# Patient Record
Sex: Male | Born: 1960 | Race: White | Hispanic: No | Smoking: Former smoker
Health system: Southern US, Community
[De-identification: ages and names within clinical notes are randomized; demographics above are authoritative.]

## PROBLEM LIST (undated history)

## (undated) DIAGNOSIS — J189 Pneumonia, unspecified organism: Secondary | ICD-10-CM

## (undated) DIAGNOSIS — T7840XA Allergy, unspecified, initial encounter: Secondary | ICD-10-CM

## (undated) HISTORY — PX: BREAST BIOPSY: SHX20

## (undated) HISTORY — DX: Allergy, unspecified, initial encounter: T78.40XA

---

## 2008-08-09 ENCOUNTER — Emergency Department (HOSPITAL_COMMUNITY): Admission: EM | Admit: 2008-08-09 | Discharge: 2008-08-09 | Payer: Self-pay | Admitting: Emergency Medicine

## 2011-05-21 LAB — DIFFERENTIAL
Basophils Absolute: 0 10*3/uL (ref 0.0–0.1)
Basophils Relative: 0 % (ref 0–1)
Eosinophils Absolute: 0 10*3/uL (ref 0.0–0.7)
Eosinophils Relative: 0 % (ref 0–5)
Lymphocytes Relative: 3 % — ABNORMAL LOW (ref 12–46)
Lymphs Abs: 0.4 10*3/uL — ABNORMAL LOW (ref 0.7–4.0)
Monocytes Absolute: 0.3 10*3/uL (ref 0.1–1.0)
Monocytes Relative: 2 % — ABNORMAL LOW (ref 3–12)
Neutro Abs: 10.2 10*3/uL — ABNORMAL HIGH (ref 1.7–7.7)
Neutrophils Relative %: 94 % — ABNORMAL HIGH (ref 43–77)

## 2011-05-21 LAB — BASIC METABOLIC PANEL
BUN: 16 mg/dL (ref 6–23)
CO2: 24 mEq/L (ref 19–32)
Calcium: 8.6 mg/dL (ref 8.4–10.5)
Chloride: 108 mEq/L (ref 96–112)
Creatinine, Ser: 0.76 mg/dL (ref 0.4–1.5)
GFR calc Af Amer: >60 mL/min (ref >60)
GFR calc non Af Amer: >60 mL/min (ref >60)
Glucose, Bld: 123 mg/dL — ABNORMAL HIGH (ref 70–99)
Potassium: 3.6 mEq/L (ref 3.5–5.1)
Sodium: 141 mEq/L (ref 135–145)

## 2011-05-21 LAB — RAPID STREP SCREEN (MED CTR MEBANE ONLY): Streptococcus, Group A Screen (Direct): NEGATIVE

## 2011-05-21 LAB — CBC
HCT: 51.6 % (ref 39.0–52.0)
Hemoglobin: 17.7 g/dL — ABNORMAL HIGH (ref 13.0–17.0)
MCHC: 34.2 g/dL (ref 30.0–36.0)
MCV: 96.4 fL (ref 78.0–100.0)
Platelets: 254 10*3/uL (ref 150–400)
RBC: 5.36 MIL/uL (ref 4.22–5.81)
RDW: 13.3 % (ref 11.5–15.5)
WBC: 10.9 10*3/uL — ABNORMAL HIGH (ref 4.0–10.5)

## 2013-02-27 ENCOUNTER — Other Ambulatory Visit (INDEPENDENT_AMBULATORY_CARE_PROVIDER_SITE_OTHER): Payer: Managed Care, Other (non HMO)

## 2013-02-27 ENCOUNTER — Ambulatory Visit (INDEPENDENT_AMBULATORY_CARE_PROVIDER_SITE_OTHER)
Admission: RE | Admit: 2013-02-27 | Discharge: 2013-02-27 | Disposition: A | Discharge status: Home / Self Care | Payer: Managed Care, Other (non HMO) | Source: Ambulatory Visit | Attending: Internal Medicine | Admitting: Internal Medicine

## 2013-02-27 ENCOUNTER — Encounter: Payer: Self-pay | Admitting: Internal Medicine

## 2013-02-27 ENCOUNTER — Ambulatory Visit (INDEPENDENT_AMBULATORY_CARE_PROVIDER_SITE_OTHER): Payer: Managed Care, Other (non HMO) | Admitting: Internal Medicine

## 2013-02-27 VITALS — BP 120/84 | HR 72 | Temp 97.7°F | Resp 16 | Ht 69.0 in | Wt 183.0 lb

## 2013-02-27 DIAGNOSIS — R0609 Other forms of dyspnea: Secondary | ICD-10-CM

## 2013-02-27 DIAGNOSIS — R0600 Dyspnea, unspecified: Secondary | ICD-10-CM | POA: Insufficient documentation

## 2013-02-27 DIAGNOSIS — Z0000 Encounter for general adult medical examination without abnormal findings: Secondary | ICD-10-CM | POA: Insufficient documentation

## 2013-02-27 DIAGNOSIS — R7989 Other specified abnormal findings of blood chemistry: Secondary | ICD-10-CM

## 2013-02-27 DIAGNOSIS — R0989 Other specified symptoms and signs involving the circulatory and respiratory systems: Secondary | ICD-10-CM

## 2013-02-27 DIAGNOSIS — F17200 Nicotine dependence, unspecified, uncomplicated: Secondary | ICD-10-CM | POA: Insufficient documentation

## 2013-02-27 LAB — CBC WITH DIFFERENTIAL/PLATELET
Basophils Absolute: 0 10*3/uL (ref 0.0–0.1)
Basophils Relative: 0.3 % (ref 0.0–3.0)
Eosinophils Absolute: 0.2 10*3/uL (ref 0.0–0.7)
Eosinophils Relative: 1.5 % (ref 0.0–5.0)
HCT: 51 % (ref 39.0–52.0)
Hemoglobin: 17.4 g/dL — ABNORMAL HIGH (ref 13.0–17.0)
Lymphocytes Relative: 15.9 % (ref 12.0–46.0)
Lymphs Abs: 2.4 10*3/uL (ref 0.7–4.0)
MCHC: 34.1 g/dL (ref 30.0–36.0)
MCV: 97.3 fl (ref 78.0–100.0)
Monocytes Absolute: 0.6 10*3/uL (ref 0.1–1.0)
Monocytes Relative: 3.9 % (ref 3.0–12.0)
Neutro Abs: 11.6 10*3/uL — ABNORMAL HIGH (ref 1.4–7.7)
Neutrophils Relative %: 78.4 % — ABNORMAL HIGH (ref 43.0–77.0)
Platelets: 266 10*3/uL (ref 150.0–400.0)
RBC: 5.24 Mil/uL (ref 4.22–5.81)
RDW: 13.2 % (ref 11.5–14.6)
WBC: 14.8 10*3/uL — ABNORMAL HIGH (ref 4.5–10.5)

## 2013-02-27 LAB — URINALYSIS, ROUTINE W REFLEX MICROSCOPIC
Bilirubin Urine: NEGATIVE
Ketones, ur: NEGATIVE
Leukocytes, UA: NEGATIVE
Nitrite: NEGATIVE
Specific Gravity, Urine: >=1.03 (ref 1.000–1.030)
Total Protein, Urine: NEGATIVE
Urine Glucose: NEGATIVE
Urobilinogen, UA: 0.2 (ref 0.0–1.0)
pH: 5.5 (ref 5.0–8.0)

## 2013-02-27 LAB — HEPATITIS C ANTIBODY: HCV Ab: NEGATIVE

## 2013-02-27 LAB — COMPREHENSIVE METABOLIC PANEL
ALT: 38 U/L (ref 0–53)
AST: 26 U/L (ref 0–37)
Albumin: 4.1 g/dL (ref 3.5–5.2)
Alkaline Phosphatase: 86 U/L (ref 39–117)
BUN: 10 mg/dL (ref 6–23)
CO2: 28 mEq/L (ref 19–32)
Calcium: 9.8 mg/dL (ref 8.4–10.5)
Chloride: 104 mEq/L (ref 96–112)
Creatinine, Ser: 0.9 mg/dL (ref 0.4–1.5)
GFR: 99.34 mL/min (ref >60.00)
Glucose, Bld: 84 mg/dL (ref 70–99)
Potassium: 4.9 mEq/L (ref 3.5–5.1)
Sodium: 139 mEq/L (ref 135–145)
Total Bilirubin: 0.7 mg/dL (ref 0.3–1.2)
Total Protein: 7.2 g/dL (ref 6.0–8.3)

## 2013-02-27 LAB — LIPID PANEL
Cholesterol: 216 mg/dL — ABNORMAL HIGH (ref 0–200)
HDL: 39.3 mg/dL (ref >39.00)
Total CHOL/HDL Ratio: 5
Triglycerides: 250 mg/dL — ABNORMAL HIGH (ref 0.0–149.0)
VLDL: 50 mg/dL — ABNORMAL HIGH (ref 0.0–40.0)

## 2013-02-27 LAB — TSH: TSH: 0.9 u[IU]/mL (ref 0.35–5.50)

## 2013-02-27 LAB — PSA: PSA: 0.52 ng/mL (ref 0.10–4.00)

## 2013-02-27 LAB — LDL CHOLESTEROL, DIRECT: Direct LDL: 137.1 mg/dL

## 2013-02-27 NOTE — Patient Instructions (Signed)
Shortness of Breath Shortness of breath means you have trouble breathing. Shortness of breath may indicate that you have a medical problem. You should seek immediate medical care for shortness of breath. CAUSES   Not enough oxygen in the air (as with high altitudes or a smoke-filled room).  Short-term (acute) lung disease, including:  Infections, such as pneumonia.  Fluid in the lungs, such as heart failure.  A blood clot in the lungs (pulmonary embolism).  Long-term (chronic) lung diseases.  Heart disease (heart attack, angina, heart failure, and others).  Low red blood cells (anemia).  Poor physical fitness. This can cause shortness of breath when you exercise.  Chest or back injuries or stiffness.  Being overweight.  Smoking.  Anxiety. This can make you feel like you are not getting enough air. DIAGNOSIS  Serious medical problems can usually be found during your physical exam. Tests may also be done to determine why you are having shortness of breath. Tests may include:  Chest X-rays.  Lung function tests.  Blood tests.  Electrocardiography.  Exercise testing.  Echocardiography.  Imaging scans. Your caregiver may not be able to find a cause for your shortness of breath after your exam. In this case, it is important to have a follow-up exam with your caregiver as directed.  TREATMENT  Treatment for shortness of breath depends on the cause of your symptoms and can vary greatly. HOME CARE INSTRUCTIONS   Do not smoke. Smoking is a common cause of shortness of breath. If you smoke, ask for help to quit.  Avoid being around chemicals or things that may bother your breathing, such as paint fumes and dust.  Rest as needed. Slowly resume your usual activities.  If medicines were prescribed, take them as directed for the full length of time directed. This includes oxygen and any inhaled medicines.  Keep all follow-up appointments as directed by your caregiver. SEEK  MEDICAL CARE IF:   Your condition does not improve in the time expected.  You have a hard time doing your normal activities even with rest.  You have any side effects or problems with the medicines prescribed.  You develop any new symptoms. SEEK IMMEDIATE MEDICAL CARE IF:   Your shortness of breath gets worse.  You feel lightheaded, faint, or develop a cough not controlled with medicines.  You start coughing up blood.  You have pain with breathing.  You have chest pain or pain in your arms, shoulders, or abdomen.  You have a fever.  You are unable to walk up stairs or exercise the way you normally do. MAKE SURE YOU:  Understand these instructions.  Will watch your condition.  Will get help right away if you are not doing well or get worse. Document Released: 04/27/2001 Document Revised: 02/01/2012 Document Reviewed: 10/18/2011 [facility] Patient Information 2014 [place], [place]. Health Maintenance, Males A healthy lifestyle and preventative care can promote health and wellness.  Maintain regular health, dental, and eye exams.  Eat a healthy diet. Foods like vegetables, fruits, whole grains, low-fat dairy products, and lean protein foods contain the nutrients you need without too many calories. Decrease your intake of foods high in solid fats, added sugars, and salt. Get information about a proper diet from your caregiver, if necessary.  Regular physical exercise is one of the most important things you can do for your health. Most adults should get at least 150 minutes of moderate-intensity exercise (any activity that increases your heart rate and causes you to sweat) each  week. In addition, most adults need muscle-strengthening exercises on 2 or more days a week.   Maintain a healthy weight. The body mass index (BMI) is a screening tool to identify possible weight problems. It provides an estimate of body fat based on height and weight. Your caregiver can help determine your  BMI, and can help you achieve or maintain a healthy weight. For adults 20 years and older:  A BMI below 18.5 is considered underweight.  A BMI of 18.5 to 24.9 is normal.  A BMI of 25 to 29.9 is considered overweight.  A BMI of 30 and above is considered obese.  Maintain normal blood lipids and cholesterol by exercising and minimizing your intake of saturated fat. Eat a balanced diet with plenty of fruits and vegetables. Blood tests for lipids and cholesterol should begin at age 87 and be repeated every 5 years. If your lipid or cholesterol levels are high, you are over 50, or you are a high risk for heart disease, you may need your cholesterol levels checked more frequently.Ongoing high lipid and cholesterol levels should be treated with medicines, if diet and exercise are not effective.  If you smoke, find out from your caregiver how to quit. If you do not use tobacco, do not start.  If you choose to drink alcohol, do not exceed 2 drinks per day. One drink is considered to be 12 ounces (355 mL) of beer, 5 ounces (148 mL) of wine, or 1.5 ounces (44 mL) of liquor.  Avoid use of street drugs. Do not share needles with anyone. Ask for help if you need support or instructions about stopping the use of drugs.  High blood pressure causes heart disease and increases the risk of stroke. Blood pressure should be checked at least every 1 to 2 years. Ongoing high blood pressure should be treated with medicines if weight loss and exercise are not effective.  If you are 32 to 52 years old, ask your caregiver if you should take aspirin to prevent heart disease.  Diabetes screening involves taking a blood sample to check your fasting blood sugar level. This should be done once every 3 years, after age 55, if you are within normal weight and without risk factors for diabetes. Testing should be considered at a younger age or be carried out more frequently if you are overweight and have at least 1 risk factor  for diabetes.  Colorectal cancer can be detected and often prevented. Most routine colorectal cancer screening begins at the age of 57 and continues through age 55. However, your caregiver may recommend screening at an earlier age if you have risk factors for colon cancer. On a yearly basis, your caregiver may provide home test kits to check for hidden blood in the stool. Use of a small camera at the end of a tube, to directly examine the colon (sigmoidoscopy or colonoscopy), can detect the earliest forms of colorectal cancer. Talk to your caregiver about this at age 65, when routine screening begins. Direct examination of the colon should be repeated every 5 to 10 years through age 60, unless early forms of pre-cancerous polyps or small growths are found.  Hepatitis C blood testing is recommended for all people born from [birth year] through 1965 and any individual with known risks for hepatitis C.  Healthy men should no longer receive prostate-specific antigen (PSA) blood tests as part of routine cancer screening. Consult with your caregiver about prostate cancer screening.  Testicular cancer screening is not recommended  for adolescents or adult males who have no symptoms. Screening includes self-exam, caregiver exam, and other screening tests. Consult with your caregiver about any symptoms you have or any concerns you have about testicular cancer.  Practice safe sex. Use condoms and avoid high-risk sexual practices to reduce the spread of sexually transmitted infections (STIs).  Use sunscreen with a sun protection factor (SPF) of 30 or greater. Apply sunscreen liberally and repeatedly throughout the day. You should seek shade when your shadow is shorter than you. Protect yourself by wearing long sleeves, pants, a wide-brimmed hat, and sunglasses year round, whenever you are outdoors.  Notify your caregiver of new moles or changes in moles, especially if there is a change in shape or color. Also notify your  caregiver if a mole is larger than the size of a pencil eraser.  A one-time screening for abdominal aortic aneurysm (AAA) and surgical repair of large AAAs by sound wave imaging (ultrasonography) is recommended for ages 77 to 6 years who are current or former smokers.  Stay current with your immunizations. Document Released: 01/29/2008 Document Revised: 10/25/2011 Document Reviewed: 12/28/2010 [facility] Patient Information 2014 [place], [place].

## 2013-02-27 NOTE — Progress Notes (Signed)
Subjective:    Patient ID: Joseph [name], male    DOB: [birth year], 52 y.o.   MRN: [id number]  Shortness of Breath This is a new problem. Episode onset: gradual onset DOE 6 months ago. The problem occurs intermittently. The problem has been unchanged. Pertinent negatives include no abdominal pain, chest pain, claudication, coryza, ear pain, fever, headaches, hemoptysis, leg pain, leg swelling, neck pain, orthopnea, PND, rash, rhinorrhea, sore throat, sputum production, swollen glands, syncope, vomiting or wheezing. Nothing aggravates the symptoms. Risk factors include smoking. He has tried nothing for the symptoms. The treatment provided no relief.      Review of Systems  Constitutional: Negative.  Negative for fever, chills, diaphoresis, activity change, appetite change, fatigue and unexpected weight change.  HENT: Negative.  Negative for ear pain, sore throat, rhinorrhea and neck pain.   Eyes: Negative.   Respiratory: Positive for shortness of breath. Negative for apnea, cough, hemoptysis, sputum production, choking, chest tightness, wheezing and stridor.   Cardiovascular: Negative.  Negative for chest pain, palpitations, orthopnea, claudication, leg swelling, syncope and PND.  Gastrointestinal: Negative.  Negative for nausea, vomiting, abdominal pain, diarrhea, constipation and anal bleeding.  Endocrine: Negative.   Genitourinary: Negative.   Musculoskeletal: Negative.   Skin: Negative.  Negative for rash.  Allergic/Immunologic: Negative.   Neurological: Negative.  Negative for dizziness, tremors, seizures, syncope, facial asymmetry, speech difficulty, weakness, light-headedness, numbness and headaches.  Hematological: Negative.  Negative for adenopathy. Does not bruise/bleed easily.  Psychiatric/Behavioral: Negative.        Objective:   Physical Exam  Vitals reviewed. Constitutional: He appears well-developed and well-nourished. No distress.  HENT:  Head: Normocephalic and  atraumatic.  Mouth/Throat: Oropharynx is clear and moist. No oropharyngeal exudate.  Eyes: Conjunctivae are normal. Right eye exhibits no discharge. Left eye exhibits no discharge. No scleral icterus.  Neck: Normal range of motion. Neck supple. No JVD present. No tracheal deviation present. No thyromegaly present.  Cardiovascular: Normal rate, regular rhythm, normal heart sounds and intact distal pulses.  Exam reveals no gallop and no friction rub.   No murmur heard. Pulmonary/Chest: Effort normal and breath sounds normal. No stridor. No respiratory distress. He has no wheezes. He has no rales. He exhibits no tenderness.  Abdominal: Soft. Bowel sounds are normal. He exhibits no distension and no mass. There is no tenderness. There is no rebound and no guarding. Hernia confirmed negative in the right inguinal area and confirmed negative in the left inguinal area.  Genitourinary: Rectum normal, prostate normal, testes normal and penis normal. Rectal exam shows no external hemorrhoid, no internal hemorrhoid, no fissure, no mass, no tenderness and anal tone normal. Guaiac negative stool. Prostate is not enlarged and not tender. Right testis shows no mass, no swelling and no tenderness. Right testis is descended. Left testis shows no mass, no swelling and no tenderness. Left testis is descended. Circumcised. No penile erythema or penile tenderness. No discharge found.  Musculoskeletal: Normal range of motion. He exhibits no edema and no tenderness.  Lymphadenopathy:    He has no cervical adenopathy.       Right: No inguinal adenopathy present.       Left: No inguinal adenopathy present.  Skin: He is not diaphoretic.    Lab Results  Component Value Date   WBC 14.8* 02/27/2013   HGB 17.4* 02/27/2013   HCT 51.0 02/27/2013   PLT 266.0 02/27/2013   GLUCOSE 84 02/27/2013   CHOL 216* 02/27/2013   TRIG 250.0* 02/27/2013   HDL  39.30 02/27/2013   LDLDIRECT 137.1 02/27/2013   ALT 38 02/27/2013   AST 26 02/27/2013    NA 139 02/27/2013   K 4.9 02/27/2013   CL 104 02/27/2013   CREATININE 0.9 02/27/2013   BUN 10 02/27/2013   CO2 28 02/27/2013   TSH 0.90 02/27/2013   PSA 0.52 02/27/2013        Assessment & Plan:

## 2013-02-27 NOTE — Assessment & Plan Note (Signed)
He is not interested in starting chantix He is willing to use nicotine patches to help him quit smoking

## 2013-02-27 NOTE — Assessment & Plan Note (Signed)
EKG and CXR are normal Labs show no causes for DOE - I think this early lung disease from tobacco abuse

## 2013-02-27 NOTE — Assessment & Plan Note (Signed)
Exam done Vaccines were reviewed Labs ordered He was referred for a colonoscopy Pt ed material was given 

## 2013-02-28 ENCOUNTER — Encounter: Payer: Self-pay | Admitting: Internal Medicine

## 2013-05-09 ENCOUNTER — Encounter: Payer: Self-pay | Admitting: *Deleted

## 2013-12-05 ENCOUNTER — Ambulatory Visit (INDEPENDENT_AMBULATORY_CARE_PROVIDER_SITE_OTHER): Payer: Managed Care, Other (non HMO) | Admitting: Internal Medicine

## 2013-12-05 ENCOUNTER — Encounter: Payer: Self-pay | Admitting: Internal Medicine

## 2013-12-05 ENCOUNTER — Telehealth: Payer: Self-pay | Admitting: Internal Medicine

## 2013-12-05 VITALS — BP 118/86 | HR 99 | Temp 98.8°F | Resp 16 | Ht 69.0 in | Wt 184.6 lb

## 2013-12-05 DIAGNOSIS — F17200 Nicotine dependence, unspecified, uncomplicated: Secondary | ICD-10-CM

## 2013-12-05 MED ORDER — VARENICLINE TARTRATE 1 MG PO TABS: 1.0000 mg | ORAL_TABLET | Freq: Two times a day (BID) | ORAL | Status: DC

## 2013-12-05 MED ORDER — VARENICLINE TARTRATE 0.5 MG X 11 & 1 MG X 42 PO MISC: ORAL | Status: DC

## 2013-12-05 NOTE — Progress Notes (Signed)
   Subjective:    Patient ID: Joseph [name], male    DOB: 04-18-1961, 53 y.o.   MRN: [id number]  HPI Comments: He wants to quit smoking, he has been using the nicotine patch but he has developed an allergy to it (itching).     Review of Systems  Constitutional: Negative.  Negative for fever, chills, diaphoresis, appetite change and fatigue.  HENT: Negative.   Eyes: Negative.   Respiratory: Negative.  Negative for cough, choking, chest tightness, shortness of breath and stridor.   Cardiovascular: Negative.  Negative for chest pain, palpitations and leg swelling.  Gastrointestinal: Negative.  Negative for nausea, vomiting, abdominal pain, diarrhea, constipation and blood in stool.  Endocrine: Negative.   Genitourinary: Negative.   Musculoskeletal: Negative.   Skin: Negative.   Allergic/Immunologic: Negative.   Neurological: Negative.  Negative for dizziness, tremors, facial asymmetry, weakness, light-headedness and numbness.  Hematological: Negative.  Negative for adenopathy. Does not bruise/bleed easily.  Psychiatric/Behavioral: Negative.        Objective:   Physical Exam  Vitals reviewed. Constitutional: He is oriented to person, place, and time. He appears well-developed and well-nourished. No distress.  HENT:  Head: Normocephalic and atraumatic.  Mouth/Throat: Oropharynx is clear and moist. No oropharyngeal exudate.  Eyes: Conjunctivae are normal. Right eye exhibits no discharge. Left eye exhibits no discharge. No scleral icterus.  Neck: Normal range of motion. Neck supple. No JVD present. No tracheal deviation present. No thyromegaly present.  Cardiovascular: Normal rate, regular rhythm, normal heart sounds and intact distal pulses.  Exam reveals no gallop and no friction rub.   No murmur heard. Pulmonary/Chest: Effort normal and breath sounds normal. No stridor. No respiratory distress. He has no wheezes. He has no rales. He exhibits no tenderness.  Abdominal: Soft. Bowel  sounds are normal. He exhibits no distension and no mass. There is no tenderness. There is no rebound and no guarding.  Musculoskeletal: Normal range of motion. He exhibits no edema and no tenderness.  Lymphadenopathy:    He has no cervical adenopathy.  Neurological: He is oriented to person, place, and time.  Skin: Skin is warm and dry. No rash noted. He is not diaphoretic. No erythema. No pallor.  Psychiatric: He has a normal mood and affect. His behavior is normal. Judgment and thought content normal.     Lab Results  Component Value Date   WBC 14.8* 02/27/2013   HGB 17.4* 02/27/2013   HCT 51.0 02/27/2013   PLT 266.0 02/27/2013   GLUCOSE 84 02/27/2013   CHOL 216* 02/27/2013   TRIG 250.0* 02/27/2013   HDL 39.30 02/27/2013   LDLDIRECT 137.1 02/27/2013   ALT 38 02/27/2013   AST 26 02/27/2013   NA 139 02/27/2013   K 4.9 02/27/2013   CL 104 02/27/2013   CREATININE 0.9 02/27/2013   BUN 10 02/27/2013   CO2 28 02/27/2013   TSH 0.90 02/27/2013   PSA 0.52 02/27/2013       Assessment & Plan:

## 2013-12-05 NOTE — Telephone Encounter (Signed)
Relevant patient education assigned to patient using Emmi. ° °

## 2013-12-05 NOTE — Patient Instructions (Signed)
Smoking Cessation Quitting smoking is important to your health and has many advantages. However, it is not always easy to quit since nicotine is a very addictive drug. Often times, people try 3 times or more before being able to quit. This document explains the best ways for you to prepare to quit smoking. Quitting takes hard work and a lot of effort, but you can do it. ADVANTAGES OF QUITTING SMOKING  You will live longer, feel better, and live better.  Your body will feel the impact of quitting smoking almost immediately.  Within 20 minutes, blood pressure decreases. Your pulse returns to its normal level.  After 8 hours, carbon monoxide levels in the blood return to normal. Your oxygen level increases.  After 24 hours, the chance of having a heart attack starts to decrease. Your breath, hair, and body stop smelling like smoke.  After 48 hours, damaged nerve endings begin to recover. Your sense of taste and smell improve.  After 72 hours, the body is virtually free of nicotine. Your bronchial tubes relax and breathing becomes easier.  After 2 to 12 weeks, lungs can hold more air. Exercise becomes easier and circulation improves.  The risk of having a heart attack, stroke, cancer, or lung disease is greatly reduced.  After 1 year, the risk of coronary heart disease is cut in half.  After 5 years, the risk of stroke falls to the same as a nonsmoker.  After 10 years, the risk of lung cancer is cut in half and the risk of other cancers decreases significantly.  After 15 years, the risk of coronary heart disease drops, usually to the level of a nonsmoker.  If you are pregnant, quitting smoking will improve your chances of having a healthy baby.  The people you live with, especially any children, will be healthier.  You will have extra money to spend on things other than cigarettes. QUESTIONS TO THINK ABOUT BEFORE ATTEMPTING TO QUIT You may want to talk about your answers with your  caregiver.  Why do you want to quit?  If you tried to quit in the past, what helped and what did not?  What will be the most difficult situations for you after you quit? How will you plan to handle them?  Who can help you through the tough times? Your family? Friends? A caregiver?  What pleasures do you get from smoking? What ways can you still get pleasure if you quit? Here are some questions to ask your caregiver:  How can you help me to be successful at quitting?  What medicine do you think would be best for me and how should I take it?  What should I do if I need more help?  What is smoking withdrawal like? How can I get information on withdrawal? GET READY  Set a quit date.  Change your environment by getting rid of all cigarettes, ashtrays, matches, and lighters in your home, car, or work. Do not let people smoke in your home.  Review your past attempts to quit. Think about what worked and what did not. GET SUPPORT AND ENCOURAGEMENT You have a better chance of being successful if you have help. You can get support in many ways.  Tell your family, friends, and co-workers that you are going to quit and need their support. Ask them not to smoke around you.  Get individual, group, or telephone counseling and support. Programs are available at local hospitals and health centers. Call your local health department for   information about programs in your area.  Spiritual beliefs and practices may help some smokers quit.  Download a "quit meter" on your computer to keep track of quit statistics, such as how long you have gone without smoking, cigarettes not smoked, and money saved.  Get a self-help book about quitting smoking and staying off of tobacco. LEARN NEW SKILLS AND BEHAVIORS  Distract yourself from urges to smoke. Talk to someone, go for a walk, or occupy your time with a task.  Change your normal routine. Take a different route to work. Drink tea instead of coffee.  Eat breakfast in a different place.  Reduce your stress. Take a hot bath, exercise, or read a book.  Plan something enjoyable to do every day. Reward yourself for not smoking.  Explore interactive web-based programs that specialize in helping you quit. GET MEDICINE AND USE IT CORRECTLY Medicines can help you stop smoking and decrease the urge to smoke. Combining medicine with the above behavioral methods and support can greatly increase your chances of successfully quitting smoking.  Nicotine replacement therapy helps deliver nicotine to your body without the negative effects and risks of smoking. Nicotine replacement therapy includes nicotine gum, lozenges, inhalers, nasal sprays, and skin patches. Some may be available over-the-counter and others require a prescription.  Antidepressant medicine helps people abstain from smoking, but how this works is unknown. This medicine is available by prescription.  Nicotinic receptor partial agonist medicine simulates the effect of nicotine in your brain. This medicine is available by prescription. Ask your caregiver for advice about which medicines to use and how to use them based on your health history. Your caregiver will tell you what side effects to look out for if you choose to be on a medicine or therapy. Carefully read the information on the package. Do not use any other product containing nicotine while using a nicotine replacement product.  RELAPSE OR DIFFICULT SITUATIONS Most relapses occur within the first 3 months after quitting. Do not be discouraged if you start smoking again. Remember, most people try several times before finally quitting. You may have symptoms of withdrawal because your body is used to nicotine. You may crave cigarettes, be irritable, feel very hungry, cough often, get headaches, or have difficulty concentrating. The withdrawal symptoms are only temporary. They are strongest when you first quit, but they will go away within  10 14 days. To reduce the chances of relapse, try to:  Avoid drinking alcohol. Drinking lowers your chances of successfully quitting.  Reduce the amount of caffeine you consume. Once you quit smoking, the amount of caffeine in your body increases and can give you symptoms, such as a rapid heartbeat, sweating, and anxiety.  Avoid smokers because they can make you want to smoke.  Do not let weight gain distract you. Many smokers will gain weight when they quit, usually less than 10 pounds. Eat a healthy diet and stay active. You can always lose the weight gained after you quit.  Find ways to improve your mood other than smoking. FOR MORE INFORMATION  www.smokefree.gov  Document Released: 07/27/2001 Document Revised: 02/01/2012 Document Reviewed: 11/11/2011 [employer] Patient Information 2014 [employer].  

## 2013-12-05 NOTE — Assessment & Plan Note (Signed)
Start chantix Pt ed and samples were given

## 2013-12-05 NOTE — Progress Notes (Signed)
Pre visit review using our clinic review tool, if applicable. No additional management support is needed unless otherwise documented below in the visit note. 

## 2018-06-29 ENCOUNTER — Ambulatory Visit (INDEPENDENT_AMBULATORY_CARE_PROVIDER_SITE_OTHER): Payer: 59 | Admitting: Family Medicine

## 2018-06-29 ENCOUNTER — Encounter: Payer: Self-pay | Admitting: Family Medicine

## 2018-06-29 VITALS — BP 124/74 | HR 84 | Temp 97.7°F | Ht 69.0 in | Wt 196.2 lb

## 2018-06-29 DIAGNOSIS — J189 Pneumonia, unspecified organism: Secondary | ICD-10-CM

## 2018-06-29 MED ORDER — IPRATROPIUM BROMIDE 0.06 % NA SOLN: 2.0000 | Freq: Four times a day (QID) | NASAL | 0 refills | Status: DC

## 2018-06-29 MED ORDER — BENZONATATE 200 MG PO CAPS: 200.0000 mg | ORAL_CAPSULE | Freq: Two times a day (BID) | ORAL | 0 refills | Status: DC | PRN

## 2018-06-29 NOTE — Progress Notes (Signed)
Subjective:  Joseph [name] is a 58 y.o. male who presents today with a chief complaint of pneumonia and to establish care  HPI:  CAP, new problem Diagnosed with pneumonia at an urgent care in [place] 3 days ago.  His symptoms overall started about a week ago.  Initially included cough, fatigue, malaise, headache, nausea, and started on a course of azithromycin and Ceftin.  He was also given an injection of steroids.  Symptoms have improved a little bit over the last couple of days, however still has malaise, cough, and fatigue.  He currently vapes flavored water - has stopped nicotine use.  Medication seems to be helping with his symptoms.  No other obvious alleviating or aggravating factors.  He has concerns that his vape use could have contributed to his pneumonia.  ROS: Per HPI, otherwise a complete review of systems was negative.   PMH:  The following were reviewed and entered/updated in epic: Past Medical History:  Diagnosis Date  . Allergy    Patient Active Problem List   Diagnosis Date Noted  . Routine general medical examination at a health care facility 02/27/2013  . DOE (dyspnea on exertion) 02/27/2013  . Tobacco use disorder 02/27/2013   History reviewed. No pertinent surgical history.  Family History  Problem Relation Age of Onset  . Cancer Mother        colon cancer  . Alcohol abuse Father   . Drug abuse Father   . Cancer Sister        Breast Cancer  . Stroke Neg Hx   . Early death Neg Hx   . Hearing loss Neg Hx   . Heart disease Neg Hx   . Hyperlipidemia Neg Hx   . Hypertension Neg Hx   . Kidney disease Neg Hx     Medications- reviewed and updated Current Outpatient Medications  Medication Sig Dispense Refill  . albuterol (PROVENTIL HFA;VENTOLIN HFA) 108 (90 Base) MCG/ACT inhaler Inhale into the lungs every 6 (six) hours as needed for wheezing or shortness of breath.    Marland Kitchen azithromycin (ZITHROMAX) 250 MG tablet Take 250 mg by mouth daily.    .  cefUROXime (CEFTIN) 500 MG tablet Take 500 mg by mouth 2 (two) times daily with a meal.    . ondansetron (ZOFRAN) 8 MG tablet Take 8 mg by mouth every 8 (eight) hours as needed for nausea or vomiting.     No current facility-administered medications for this visit.     Allergies-reviewed and updated Allergies  Allergen Reactions  . Sulfa Antibiotics     Social History   Socioeconomic History  . Marital status: Divorced    Spouse name: Not on file  . Number of children: Not on file  . Years of education: Not on file  . Highest education level: Not on file  Occupational History  . Not on file  Social Needs  . Financial resource strain: Not on file  . Food insecurity:    Worry: Not on file    Inability: Not on file  . Transportation needs:    Medical: Not on file    Non-medical: Not on file  Tobacco Use  . Smoking status: Former Research scientist (life sciences)  . Smokeless tobacco: Never Used  Substance and Sexual Activity  . Alcohol use: No  . Drug use: No  . Sexual activity: Not Currently  Lifestyle  . Physical activity:    Days per week: Not on file    Minutes per session: Not on  file  . Stress: Not on file  Relationships  . Social connections:    Talks on phone: Not on file    Gets together: Not on file    Attends religious service: Not on file    Active member of club or organization: Not on file    Attends meetings of clubs or organizations: Not on file    Relationship status: Not on file  Other Topics Concern  . Not on file  Social History Narrative  . Not on file    Objective:  Physical Exam: BP 124/74 (BP Location: Left Arm, Patient Position: Sitting, Cuff Size: Normal)   Pulse 84   Temp 97.7 F (36.5 C) (Oral)   Ht 5\' 9"  (1.753 m)   Wt 196 lb 3.2 oz (89 kg)   SpO2 92%   BMI 28.97 kg/m   Gen: NAD, resting comfortably HEENT: TMs with clear effusion.  Nose mucosa erythematous and boggy with clear nasal discharge. CV: RRR with no murmurs appreciated Pulm: NWOB, CTAB  with no crackles, wheezes, or rhonchi GI: Normal bowel sounds present. Soft, Nontender, Nondistended. MSK: No edema, cyanosis, or clubbing noted Skin: Warm, dry Neuro: Grossly normal, moves all extremities Psych: Normal affect and thought content  Assessment/Plan:  Community-acquired pneumonia Lungs are clear on exam today.  Vital signs are normal.  Encouraged patient to complete his full course of antibiotics.  I will also send in Atrovent nasal spray for his rhinorrhea and Tessalon for his cough.  Discussed normal course of illness.  Discussed they may have lingering cough for the next few weeks.  Discussed reasons to return to care and seek emergent care.  Vape Use He would like to quit.  He will follow-up with our behavioral specialist soon.  Preventative Healthcare Patient was instructed to return soon for CPE. Health Maintenance Due  Topic Date Due  . HIV Screening  05/15/1976  . COLONOSCOPY  05/16/2011  . INFLUENZA VACCINE  03/16/2018   Algis Greenhouse. Jerline Pain, MD 06/29/2018 8:41 AM

## 2018-06-29 NOTE — Patient Instructions (Signed)
It was very nice to see you today!  Please finish all your antibiotics.  I will send in a nasal spray for you.  Please use 3-4 times daily as needed.  I will also send in cough medication for you.  Please let me know if your symptoms do not gradually improve over the next several days.  Come back to see me soon for your annual physical.  Take care, Dr Jerline Pain

## 2018-06-29 NOTE — Addendum Note (Signed)
Addended by: Vivi Barrack on: 06/29/2018 12:19 PM   Modules accepted: Level of Service

## 2018-10-17 ENCOUNTER — Encounter: Payer: Self-pay | Admitting: Family Medicine

## 2018-10-17 ENCOUNTER — Ambulatory Visit: Payer: Commercial Managed Care - PPO | Admitting: Family Medicine

## 2018-10-17 VITALS — BP 126/70 | HR 80 | Temp 97.9°F | Ht 69.0 in | Wt 197.6 lb

## 2018-10-17 DIAGNOSIS — L299 Pruritus, unspecified: Secondary | ICD-10-CM | POA: Insufficient documentation

## 2018-10-17 DIAGNOSIS — Z23 Encounter for immunization: Secondary | ICD-10-CM | POA: Diagnosis not present

## 2018-10-17 MED ORDER — HYDROXYZINE HCL 50 MG PO TABS: 25.0000 mg | ORAL_TABLET | Freq: Every evening | ORAL | 1 refills | Status: DC | PRN

## 2018-10-17 NOTE — Progress Notes (Signed)
   Chief Complaint:  Joseph [name] is a 58 y.o. male who presents today with a chief complaint of rash.   Assessment/Plan:  Pruritus Patient has a significant amount of xerosis cutis which is causing pruritus and histamine release.  Will treat symptomatically with hydroxyzine 25 to 100 mg at bedtime as needed.  Hopefully this will also help some with his insomnia.  Discussed potential side effects.  Also recommended over-the-counter second-generation antihistamine such as Claritin or Zyrtec to use daily.  He can use topical cortisone cream as needed to difficult to treat areas.  Discussed reasons to return to care.  Follow-up as needed.  Preventative Healthcare Patient was instructed to return soon for CPE.  Flu shot given today. Health Maintenance Due  Topic Date Due  . HIV Screening  05/15/1976  . COLONOSCOPY  05/16/2011  . INFLUENZA VACCINE  03/16/2018     Subjective:  HPI:  Rash Started a few months ago.  Located on bilateral legs and scalp.  Symptoms seem to be worsening.  Symptoms seem to be worse at night.  He has had some difficulty sleeping due to severe pruritus.  No obvious exposures.  No recent changes to soaps, detergents, or any other product.  No treatments tried.  No fevers or chills.  No sick contacts.  No other obvious alleviating or aggravating factors.  ROS: Per HPI  PMH: He reports that he has quit smoking. He has never used smokeless tobacco. He reports that he does not drink alcohol or use drugs.      Objective:  Physical Exam: BP 126/70 (BP Location: Left Arm, Patient Position: Sitting, Cuff Size: Normal)   Pulse 80   Temp 97.9 F (36.6 C) (Oral)   Ht 5\' 9"  (1.753 m)   Wt 197 lb 9.6 oz (89.6 kg)   SpO2 96%   BMI 29.18 kg/m   Wt Readings from Last 3 Encounters:  10/17/18 197 lb 9.6 oz (89.6 kg)  06/29/18 196 lb 3.2 oz (89 kg)  12/05/13 184 lb 9.6 oz (83.7 kg)  Gen: NAD, resting comfortably Skin: Diffuse xerosis cutis noted.  Several excoriations  involving anterior lower legs and posterior scalp.   Neuro: Grossly normal, moves all extremities Psych: Normal affect and thought content      Joseph Corvin M. Jerline Pain, MD 10/17/2018 12:40 PM

## 2018-10-17 NOTE — Patient Instructions (Signed)
It was very nice to see you today!  Please start hydroxyzine.  Take 1 tablet at night.  Please also start cetirizine 10 mg daily.  Let me know if your symptoms worsen or do not improve with this.  Come back to see me in a few months for your physical, or sooner as needed.  Take care, Dr Jerline Pain

## 2018-10-17 NOTE — Assessment & Plan Note (Signed)
Patient has a significant amount of xerosis cutis which is causing pruritus and histamine release.  Will treat symptomatically with hydroxyzine 25 to 100 mg at bedtime as needed.  Hopefully this will also help some with his insomnia.  Discussed potential side effects.  Also recommended over-the-counter second-generation antihistamine such as Claritin or Zyrtec to use daily.  He can use topical cortisone cream as needed to difficult to treat areas.  Discussed reasons to return to care.  Follow-up as needed.

## 2019-02-22 ENCOUNTER — Other Ambulatory Visit: Payer: Self-pay

## 2019-02-22 ENCOUNTER — Ambulatory Visit (INDEPENDENT_AMBULATORY_CARE_PROVIDER_SITE_OTHER): Payer: Commercial Managed Care - PPO | Admitting: Family Medicine

## 2019-02-22 ENCOUNTER — Encounter: Payer: Self-pay | Admitting: Family Medicine

## 2019-02-22 VITALS — BP 128/74 | HR 77 | Temp 98.0°F | Ht 69.0 in | Wt 196.2 lb

## 2019-02-22 DIAGNOSIS — Z0001 Encounter for general adult medical examination with abnormal findings: Secondary | ICD-10-CM | POA: Diagnosis not present

## 2019-02-22 DIAGNOSIS — Z1211 Encounter for screening for malignant neoplasm of colon: Secondary | ICD-10-CM

## 2019-02-22 DIAGNOSIS — Z6828 Body mass index (BMI) 28.0-28.9, adult: Secondary | ICD-10-CM

## 2019-02-22 DIAGNOSIS — Z125 Encounter for screening for malignant neoplasm of prostate: Secondary | ICD-10-CM | POA: Diagnosis not present

## 2019-02-22 DIAGNOSIS — E663 Overweight: Secondary | ICD-10-CM

## 2019-02-22 MED ORDER — KETOCONAZOLE 2 % EX SHAM: 1.0000 "application " | MEDICATED_SHAMPOO | Freq: Every day | CUTANEOUS | 0 refills | Status: DC

## 2019-02-22 NOTE — Patient Instructions (Signed)
It was very nice to see you today!  Please try the ketoconzole for your scalp.  We will check blood work today.  I will also place an order for your colonoscopy.  Please try these tips to maintain a healthy lifestyle:   Eat at least 3 REAL meals and 1-2 snacks per day.  Aim for no more than 5 hours between eating.  If you eat breakfast, please do so within one hour of getting up.    Obtain twice as many fruits/vegetables as protein or carbohydrate foods for both lunch and dinner. (Half of each meal should be fruits/vegetables, one quarter protein, and one quarter starchy cars)   Cut down on sweet beverages. This includes juice, soda, and sweet tea.    Exercise at least 150 minutes every week.   Please schedule a visit soon with our nutritionist, Aldona Bar.   Come back to see me in 1 year for your next physical or sooner as needed.   Take care, Dr Jerline Pain

## 2019-02-22 NOTE — Progress Notes (Signed)
Chief Complaint:  Joseph [name] is a 58 y.o. male who presents today for his annual comprehensive physical exam.    Assessment/Plan:  Rash Continue hydroxyzine.  Possibly could be sebaceous dermatitis.  Will send prescription for ketoconazole.  Discussed reasons to return to care.  Body mass index is 28.97 kg/m. / Overweight BMI Metric Follow Up - 02/22/19 1520      BMI Metric Follow Up-Please document annually   BMI Metric Follow Up  Education provided       Recommended referral to nutritionist.   Preventative Healthcare: Check CBC, C met, A1c, TSH, PSA, and lipid panel.  Will place order for Cologuard.  Patient Counseling(The following topics were reviewed and/or handout was given):  -Nutrition: Stressed importance of moderation in sodium/caffeine intake, saturated fat and cholesterol, caloric balance, sufficient intake of fresh fruits, vegetables, and fiber.  -Stressed the importance of regular exercise.   -Substance Abuse: Discussed cessation/primary prevention of tobacco, alcohol, or other drug use; driving or other dangerous activities under the influence; availability of treatment for abuse.   -Injury prevention: Discussed safety belts, safety helmets, smoke detector, smoking near bedding or upholstery.   -Sexuality: Discussed sexually transmitted diseases, partner selection, use of condoms, avoidance of unintended pregnancy and contraceptive alternatives.   -Dental health: Discussed importance of regular tooth brushing, flossing, and dental visits.  -Health maintenance and immunizations reviewed. Please refer to Health maintenance section.  Return to care in 1 year for next preventative visit.     Subjective:  HPI:  He has no acute complaints today.   Pruritus Pt seen for this a couple months ago.  Start on hydroxyzine.  Rash on extremities has resolved however still has pruritic rash on his scalp.  Has tried over-the-counter dandruff shampoos with no improvement.   Symptoms seem to be worse at night.   Lifestyle Diet: No specific diets or eating plans.  Tries eat overall healthy balanced diet. Exercise: No specific exercise plan regimen.  Depression screen PHQ 2/9 06/29/2018  Decreased Interest 0  Down, Depressed, Hopeless 0  PHQ - 2 Score 0    Health Maintenance Due  Topic Date Due  . HIV Screening  05/15/1976  . COLONOSCOPY  05/16/2011     ROS: Per HPI, otherwise a complete review of systems was negative.   PMH:  The following were reviewed and entered/updated in epic: Past Medical History:  Diagnosis Date  . Allergy    Patient Active Problem List   Diagnosis Date Noted  . Pruritus 10/17/2018  . Tobacco use disorder 02/27/2013   Past Surgical History:  Procedure Laterality Date  . BREAST BIOPSY     breast lump - benign    Family History  Problem Relation Age of Onset  . Cancer Mother 78       colon cancer  . Alcohol abuse Father   . Drug abuse Father   . Cancer Sister        Breast Cancer  . Stroke Neg Hx   . Early death Neg Hx   . Hearing loss Neg Hx   . Heart disease Neg Hx   . Hyperlipidemia Neg Hx   . Hypertension Neg Hx   . Kidney disease Neg Hx     Medications- reviewed and updated Current Outpatient Medications  Medication Sig Dispense Refill  . hydrOXYzine (ATARAX/VISTARIL) 50 MG tablet Take 0.5-2 tablets (25-100 mg total) by mouth at bedtime as needed (insomnia). 90 tablet 1  . ketoconazole (NIZORAL) 2 % shampoo Apply  1 application topically daily. 120 mL 0   No current facility-administered medications for this visit.     Allergies-reviewed and updated Allergies  Allergen Reactions  . Sulfa Antibiotics     Social History   Socioeconomic History  . Marital status: Divorced    Spouse name: Not on file  . Number of children: Not on file  . Years of education: Not on file  . Highest education level: Not on file  Occupational History  . Not on file  Social Needs  . Financial resource  strain: Not on file  . Food insecurity    Worry: Not on file    Inability: Not on file  . Transportation needs    Medical: Not on file    Non-medical: Not on file  Tobacco Use  . Smoking status: Former Research scientist (life sciences)  . Smokeless tobacco: Never Used  Substance and Sexual Activity  . Alcohol use: No    Comment: occasional  . Drug use: No  . Sexual activity: Not Currently  Lifestyle  . Physical activity    Days per week: Not on file    Minutes per session: Not on file  . Stress: Not on file  Relationships  . Social Herbalist on phone: Not on file    Gets together: Not on file    Attends religious service: Not on file    Active member of club or organization: Not on file    Attends meetings of clubs or organizations: Not on file    Relationship status: Not on file  Other Topics Concern  . Not on file  Social History Narrative  . Not on file        Objective:  Physical Exam: BP 128/74 (BP Location: Right Arm, Patient Position: Sitting, Cuff Size: Normal)   Pulse 77   Temp 98 F (36.7 C) (Oral)   Ht '5\' 9"'  (1.753 m)   Wt 196 lb 3.2 oz (89 kg)   SpO2 96%   BMI 28.97 kg/m   Body mass index is 28.97 kg/m. Wt Readings from Last 3 Encounters:  02/22/19 196 lb 3.2 oz (89 kg)  10/17/18 197 lb 9.6 oz (89.6 kg)  06/29/18 196 lb 3.2 oz (89 kg)   Gen: NAD, resting comfortably HEENT: TMs normal bilaterally. OP clear. No thyromegaly noted.  CV: RRR with no murmurs appreciated Pulm: NWOB, CTAB with no crackles, wheezes, or rhonchi GI: Normal bowel sounds present. Soft, Nontender, Nondistended. MSK: no edema, cyanosis, or clubbing noted Skin: warm, dry.  Discrete, erythematous lesions on occipital portion of scalp approximately 3 to 4 mm in diameter with overlying excoriations.   Neuro: CN2-12 grossly intact. Strength 5/5 in upper and lower extremities. Reflexes symmetric and intact bilaterally.  Psych: Normal affect and thought content     Taegan Haider M. Jerline Pain, MD 02/22/2019  3:25 PM

## 2019-02-23 LAB — CBC
HCT: 48 % (ref 39.0–52.0)
Hemoglobin: 16.6 g/dL (ref 13.0–17.0)
MCHC: 34.6 g/dL (ref 30.0–36.0)
MCV: 93.2 fl (ref 78.0–100.0)
Platelets: 280 10*3/uL (ref 150.0–400.0)
RBC: 5.15 Mil/uL (ref 4.22–5.81)
RDW: 12.9 % (ref 11.5–15.5)
WBC: 7.8 10*3/uL (ref 4.0–10.5)

## 2019-02-23 LAB — COMPREHENSIVE METABOLIC PANEL
ALT: 45 U/L (ref 0–53)
AST: 33 U/L (ref 0–37)
Albumin: 4.4 g/dL (ref 3.5–5.2)
Alkaline Phosphatase: 82 U/L (ref 39–117)
BUN: 14 mg/dL (ref 6–23)
CO2: 24 mEq/L (ref 19–32)
Calcium: 8.8 mg/dL (ref 8.4–10.5)
Chloride: 105 mEq/L (ref 96–112)
Creatinine, Ser: 0.9 mg/dL (ref 0.40–1.50)
GFR: 86.74 mL/min (ref >60.00)
Glucose, Bld: 87 mg/dL (ref 70–99)
Potassium: 3.8 mEq/L (ref 3.5–5.1)
Sodium: 139 mEq/L (ref 135–145)
Total Bilirubin: 0.7 mg/dL (ref 0.2–1.2)
Total Protein: 6.9 g/dL (ref 6.0–8.3)

## 2019-02-23 LAB — PSA: PSA: 0.35 ng/mL (ref 0.10–4.00)

## 2019-02-23 LAB — LIPID PANEL
Cholesterol: 169 mg/dL (ref 0–200)
HDL: 39.4 mg/dL (ref >39.00)
LDL Cholesterol: 100 mg/dL — ABNORMAL HIGH (ref 0–99)
NonHDL: 129.28
Total CHOL/HDL Ratio: 4
Triglycerides: 146 mg/dL (ref 0.0–149.0)
VLDL: 29.2 mg/dL (ref 0.0–40.0)

## 2019-02-23 LAB — TSH: TSH: 1 u[IU]/mL (ref 0.35–4.50)

## 2019-02-23 LAB — HEMOGLOBIN A1C: Hgb A1c MFr Bld: 5.4 % (ref 4.6–6.5)

## 2019-02-23 NOTE — Progress Notes (Signed)
Please inform patient of the following:  Blood work is all NORMAL. Would like for him to keep up the good work and we can recheck in a year or so.  Will contact him with cologuard results once we receive them.  Algis Greenhouse. Jerline Pain, MD 02/23/2019 12:03 PM

## 2019-03-08 LAB — COLOGUARD: Cologuard: NEGATIVE

## 2020-01-31 ENCOUNTER — Encounter: Payer: Self-pay | Admitting: Family Medicine

## 2020-01-31 ENCOUNTER — Telehealth (INDEPENDENT_AMBULATORY_CARE_PROVIDER_SITE_OTHER): Payer: Commercial Managed Care - PPO | Admitting: Family Medicine

## 2020-01-31 VITALS — Temp 97.8°F | Wt 195.0 lb

## 2020-01-31 DIAGNOSIS — R519 Headache, unspecified: Secondary | ICD-10-CM

## 2020-01-31 DIAGNOSIS — R5383 Other fatigue: Secondary | ICD-10-CM

## 2020-01-31 DIAGNOSIS — R42 Dizziness and giddiness: Secondary | ICD-10-CM

## 2020-01-31 NOTE — Progress Notes (Signed)
Virtual Visit via Video Note  I connected with Joseph [name]  on 01/31/20 at  5:20 PM EDT by a video enabled telemedicine application and verified that I am speaking with the correct person using two identifiers.  Location patient: home, [place] Location provider:work or home office Persons participating in the virtual visit: patient, provider  I discussed the limitations of evaluation and management by telemedicine and the availability of in person appointments. The patient expressed understanding and agreed to proceed.   HPI:  Acute visit for feeling sick: -started the day after his 2nd pfizer vaccine on June 3rd -fatigue, HA, dizziness "all the time" but is mild, mild visual changes - but says it is time for his eye check up -feels like is getting worse -just feels like he needs to lie down -denies fevers, NVD, cough, congestion, SOB, rash, weight loss, urinary symptoms, falls, loc or pre-syncope, CP, weakness or numbness of limbs, tick bites, bruising, bleeding, Sick contacts -denies tobacco or alcohol on a regular basis -reports tends to have body aches at baseline from DDD, OA - unchanged with this  ROS: See pertinent positives and negatives per HPI.  Past Medical History:  Diagnosis Date  . Allergy     Past Surgical History:  Procedure Laterality Date  . BREAST BIOPSY     breast lump - benign    Family History  Problem Relation Age of Onset  . Cancer Mother 42       colon cancer  . Alcohol abuse Father   . Drug abuse Father   . Cancer Sister        Breast Cancer  . Stroke Neg Hx   . Early death Neg Hx   . Hearing loss Neg Hx   . Heart disease Neg Hx   . Hyperlipidemia Neg Hx   . Hypertension Neg Hx   . Kidney disease Neg Hx     SOCIAL HX: see hpi   Current Outpatient Medications:  .  hydrOXYzine (ATARAX/VISTARIL) 50 MG tablet, Take 0.5-2 tablets (25-100 mg total) by mouth at bedtime as needed (insomnia)., Disp: 90 tablet, Rfl: 1  EXAM:  VITALS per patient if  applicable:  GENERAL: alert, oriented, lying in bed, no acute distress  HEENT: atraumatic, conjunttiva clear, no obvious abnormalities on inspection of external nose and ears  NECK: normal movements of the head and neck  LUNGS: on inspection no signs of respiratory distress, breathing rate appears normal, no obvious gross SOB, gasping or wheezing  CV: no obvious cyanosis  MS: moves all visible extremities without noticeable abnormality  PSYCH/NEURO: pleasant and cooperative, no obvious depression or anxiety, speech and thought processing grossly intact  ASSESSMENT AND PLAN:  Discussed the following assessment and plan:  Frequent headaches  Fatigue, unspecified type  Dizziness  -we discussed possible serious and likely etiologies, options for evaluation and workup, limitations of telemedicine visit vs in person visit, treatment, treatment risks and precautions. Concerning symptoms and discussed a wide range of possible etiologies. Advised in-person evaluation and will likely need lab work and imaging for further workup. Possible atypical side effects related to the vaccine...but duration of symptoms warrants evaluation to r/o other. Patient in agreement and he prefers to be seen in his PCP office. Sent message to his PCP office to request appointment in the next few days. Advised pt to call the office tomorrow to check on this/request appt. Patient agrees to seek prompt in person care at [facility] or ER in the meantime if worsening, new symptoms arise,  severe symptoms or if he is not able to get in with his PCP office. He had question about analgesic options as reports he never take medications. Discussed OTC analgesics, dosing, risks..   I discussed the assessment and treatment plan with the patient. The patient was provided an opportunity to ask questions and all were answered. The patient agreed with the plan and demonstrated an understanding of the instructions.   The patient was advised  to call back or seek an in-person evaluation if the symptoms worsen or if the condition fails to improve as anticipated.   Lucretia Kern, DO

## 2020-02-01 NOTE — Progress Notes (Signed)
Scheduled appt with Dr. Jerline Pain on Wednesday 6/23.

## 2020-02-07 ENCOUNTER — Encounter: Payer: Self-pay | Admitting: Family Medicine

## 2020-02-07 ENCOUNTER — Other Ambulatory Visit: Payer: Self-pay

## 2020-02-07 ENCOUNTER — Ambulatory Visit: Payer: Commercial Managed Care - PPO | Admitting: Family Medicine

## 2020-02-07 ENCOUNTER — Ambulatory Visit (HOSPITAL_COMMUNITY)
Admission: RE | Admit: 2020-02-07 | Discharge: 2020-02-07 | Disposition: A | Discharge status: Home / Self Care | Payer: Commercial Managed Care - PPO | Source: Ambulatory Visit | Attending: Family Medicine | Admitting: Family Medicine

## 2020-02-07 VITALS — BP 126/90 | HR 85 | Temp 98.1°F | Ht 69.0 in | Wt 194.2 lb

## 2020-02-07 DIAGNOSIS — R519 Headache, unspecified: Secondary | ICD-10-CM | POA: Diagnosis present

## 2020-02-07 DIAGNOSIS — R42 Dizziness and giddiness: Secondary | ICD-10-CM | POA: Diagnosis present

## 2020-02-07 DIAGNOSIS — H5509 Other forms of nystagmus: Secondary | ICD-10-CM | POA: Diagnosis present

## 2020-02-07 DIAGNOSIS — R0989 Other specified symptoms and signs involving the circulatory and respiratory systems: Secondary | ICD-10-CM | POA: Diagnosis not present

## 2020-02-07 LAB — CBC
HCT: 48.1 % (ref 39.0–52.0)
Hemoglobin: 16.7 g/dL (ref 13.0–17.0)
MCHC: 34.7 g/dL (ref 30.0–36.0)
MCV: 93.1 fl (ref 78.0–100.0)
Platelets: 244 10*3/uL (ref 150.0–400.0)
RBC: 5.16 Mil/uL (ref 4.22–5.81)
RDW: 13.1 % (ref 11.5–15.5)
WBC: 8.2 10*3/uL (ref 4.0–10.5)

## 2020-02-07 LAB — COMPREHENSIVE METABOLIC PANEL
ALT: 31 U/L (ref 0–53)
AST: 24 U/L (ref 0–37)
Albumin: 4.3 g/dL (ref 3.5–5.2)
Alkaline Phosphatase: 88 U/L (ref 39–117)
BUN: 17 mg/dL (ref 6–23)
CO2: 29 mEq/L (ref 19–32)
Calcium: 9.4 mg/dL (ref 8.4–10.5)
Chloride: 105 mEq/L (ref 96–112)
Creatinine, Ser: 0.9 mg/dL (ref 0.40–1.50)
GFR: 86.45 mL/min (ref >60.00)
Glucose, Bld: 89 mg/dL (ref 70–99)
Potassium: 4.3 mEq/L (ref 3.5–5.1)
Sodium: 135 mEq/L (ref 135–145)
Total Bilirubin: 0.6 mg/dL (ref 0.2–1.2)
Total Protein: 6.9 g/dL (ref 6.0–8.3)

## 2020-02-07 LAB — TSH: TSH: 1.27 u[IU]/mL (ref 0.35–4.50)

## 2020-02-07 LAB — C-REACTIVE PROTEIN: CRP: <1 mg/dL (ref 0.5–20.0)

## 2020-02-07 LAB — URINALYSIS, ROUTINE W REFLEX MICROSCOPIC
Bilirubin Urine: NEGATIVE
Ketones, ur: NEGATIVE
Leukocytes,Ua: NEGATIVE
Nitrite: NEGATIVE
Specific Gravity, Urine: >=1.03 — AB (ref 1.000–1.030)
Total Protein, Urine: NEGATIVE
Urine Glucose: NEGATIVE
Urobilinogen, UA: 0.2 (ref 0.0–1.0)
pH: 5.5 (ref 5.0–8.0)

## 2020-02-07 LAB — SEDIMENTATION RATE: Sed Rate: 11 mm/hr (ref 0–20)

## 2020-02-07 NOTE — Addendum Note (Signed)
Addended by: Vivi Barrack on: 02/07/2020 01:51 PM   Modules accepted: Orders

## 2020-02-07 NOTE — Addendum Note (Signed)
Addended by: Vivi Barrack on: 02/07/2020 01:48 PM   Modules accepted: Orders

## 2020-02-07 NOTE — Patient Instructions (Signed)
It was very nice to see you today!  I think your symptoms are coming from inflammation.  This may have come from the vaccine.  This should gradually improve but I would like to check some blood work and a urine sample to make sure there is nothing else going on.  I am concerned about inflammation in your inner ear.  We will check a head CT as well.  We will contact you with the results once they are available.  Take care, Dr Jerline Pain  Please try these tips to maintain a healthy lifestyle:   Eat at least 3 REAL meals and 1-2 snacks per day.  Aim for no more than 5 hours between eating.  If you eat breakfast, please do so within one hour of getting up.    Each meal should contain half fruits/vegetables, one quarter protein, and one quarter carbs (no bigger than a computer mouse)   Cut down on sweet beverages. This includes juice, soda, and sweet tea.     Drink at least 1 glass of water with each meal and aim for at least 8 glasses per day   Exercise at least 150 minutes every week.

## 2020-02-07 NOTE — Progress Notes (Addendum)
Nussen C Cotten is a 59 y.o. male who presents today for an office visit.  Assessment/Plan:  New/Acute Problems: Headaches/Dizziness Has some worsening nystagmus on exam but otherwise normal neurologic exam.  Possibly sequela of recent vaccine though will need to check to rule out other causes.  Will check CBC, C met, TSH, urinalysis, ESR, and CRP today.  We will also send for stat head CT.  If above work-up is all normal would consider trial of prednisone or other anti-inflammatory.  If symptoms persist and above is negative would consider MRI/or referral to neurology.  Bilateral rales Do not think this is associated with above symptoms and once incidentally found on exam today.  Likely secondary to history of tobacco use and vaping.  Not currently having any pulmonary symptoms.  Will address more pressing concerns worse as above.  Would consider chest xray depending on results of above if symptoms persist.     Subjective:  HPI:  Patient seen by another provider a week ago via virtual visit for headache and dizziness after receiving second covid shot earlier this month. He has also had some more fatigue and dizziness.  Symptoms have been stable over the last week or so.  He has had nearly daily headaches.  Has a history of headaches and symptoms are similar to past but much more frequent and persistent in nature.  Also has generalized fatigue and malaise.  Also generalized myalgias.  He occasionally will take ibuprofen or aspirin which helps.  No recent illnesses.  No fevers or chills.  No chest pain or shortness of breath.  No weakness or numbness.       Objective:  Physical Exam: BP 126/90 (BP Location: Left Arm, Patient Position: Sitting, Cuff Size: Normal)   Pulse 85   Temp 98.1 F (36.7 C) (Temporal)   Ht 5' 9" (1.753 m)   Wt 194 lb 4 oz (88.1 kg)   SpO2 97%   BMI 28.69 kg/m   Gen: No acute distress, resting comfortably HEENT: Extraocular movements intact.  Horizontal  nystagmus noted with lateral eye movement. CV: Regular rate and rhythm with no murmurs appreciated Pulm: Normal work of breathing, clear to auscultation bilaterally with no crackles, wheezes, or rhonchi. Slight rales in bilateral bases.  Neuro: Cranial nerves II through XII intact.  Horizontal nystagmus noted with lateral movement as above.  Finger-nose-finger testing intact bilaterally.  Strength 5 out of 5 in upper and lower extremities.  Reflexes 2+ and symmetric bilaterally. Psych: Normal affect and thought content  Time Spent: 30 minutes of total time was spent on the date of the encounter performing the following actions: chart review prior to seeing the patient including recent visit with Dr Kim, obtaining history, performing a medically necessary exam, counseling on the treatment plan, placing orders, and documenting in our EHR.        Caleb M. Parker, MD 02/07/2020 1:44 PM  

## 2020-02-07 NOTE — Progress Notes (Signed)
Please inform patient of the following:  Good news! Head CT scan is NORMAL. Still waiting on blood work and we will call him once we get the results.  Joseph [name]. Jerline Pain, MD 02/07/2020 4:03 PM

## 2020-02-08 NOTE — Progress Notes (Signed)
Please inform patient of the following:  Patient's blood work is NORMAL but he had signs of dehydration in his urine and also a small amount of blood. Dehydration could explain his symptoms. Would like for him to make sure he is getting plenty of fluid the next few days (at least 64 oz of water) and I would like to see him back next week to recheck his urine and recheck his symptoms.

## 2020-02-13 ENCOUNTER — Ambulatory Visit: Payer: Commercial Managed Care - PPO | Admitting: Family Medicine

## 2020-02-13 ENCOUNTER — Encounter: Payer: Self-pay | Admitting: Family Medicine

## 2020-02-13 ENCOUNTER — Ambulatory Visit (INDEPENDENT_AMBULATORY_CARE_PROVIDER_SITE_OTHER)
Admission: RE | Admit: 2020-02-13 | Discharge: 2020-02-13 | Disposition: A | Discharge status: Home / Self Care | Payer: Commercial Managed Care - PPO | Source: Ambulatory Visit | Attending: Family Medicine | Admitting: Family Medicine

## 2020-02-13 ENCOUNTER — Other Ambulatory Visit: Payer: Self-pay

## 2020-02-13 VITALS — BP 122/80 | HR 68 | Temp 98.0°F | Ht 69.0 in | Wt 194.0 lb

## 2020-02-13 DIAGNOSIS — H5500 Unspecified nystagmus: Secondary | ICD-10-CM | POA: Diagnosis not present

## 2020-02-13 DIAGNOSIS — R0989 Other specified symptoms and signs involving the circulatory and respiratory systems: Secondary | ICD-10-CM

## 2020-02-13 DIAGNOSIS — R319 Hematuria, unspecified: Secondary | ICD-10-CM | POA: Diagnosis not present

## 2020-02-13 DIAGNOSIS — H5509 Other forms of nystagmus: Secondary | ICD-10-CM

## 2020-02-13 DIAGNOSIS — R519 Headache, unspecified: Secondary | ICD-10-CM | POA: Diagnosis not present

## 2020-02-13 DIAGNOSIS — R42 Dizziness and giddiness: Secondary | ICD-10-CM

## 2020-02-13 LAB — URINALYSIS, ROUTINE W REFLEX MICROSCOPIC
Bilirubin Urine: NEGATIVE
Hgb urine dipstick: NEGATIVE
Ketones, ur: NEGATIVE
Leukocytes,Ua: NEGATIVE
Nitrite: NEGATIVE
RBC / HPF: NONE SEEN (ref 0–?)
Specific Gravity, Urine: 1.025 (ref 1.000–1.030)
Total Protein, Urine: NEGATIVE
Urine Glucose: NEGATIVE
Urobilinogen, UA: 0.2 (ref 0.0–1.0)
WBC, UA: NONE SEEN (ref 0–?)
pH: 6 (ref 5.0–8.0)

## 2020-02-13 LAB — POC URINALSYSI DIPSTICK (AUTOMATED)
Bilirubin, UA: NEGATIVE
Blood, UA: POSITIVE
Glucose, UA: NEGATIVE
Ketones, UA: NEGATIVE
Leukocytes, UA: NEGATIVE
Nitrite, UA: NEGATIVE
Protein, UA: NEGATIVE
Spec Grav, UA: >=1.03 — AB (ref 1.010–1.025)
Urobilinogen, UA: 0.2 E.U./dL
pH, UA: 5.5 (ref 5.0–8.0)

## 2020-02-13 LAB — CK: Total CK: 105 U/L (ref 7–232)

## 2020-02-13 MED ORDER — PREDNISONE 50 MG PO TABS: ORAL_TABLET | ORAL | 0 refills | Status: DC

## 2020-02-13 NOTE — Progress Notes (Signed)
   Joseph [name] is a 59 y.o. male who presents today for an office visit.  Assessment/Plan:  Nystagmus / Headache Recent work-up including labs and head CT last week with no clear explanation for symptoms.  Given ongoing symptoms in addition of dizziness will obtain brain MRI at this point.  His neurologic exam today is otherwise unchanged and stable and with no other red flags.  He does have some mild middle ear effusion.  Will give a short course of prednisone to see if this helps alleviate any symptoms.  If MRI is negative times have some symptoms would consider referral to ENT or neurology.  Rales Unclear if this is related to the above.  Likely secondary to vape use.  Will check chest x-ray today.  Hematuria Again noted on point-of-care UA today.  Will send out for urine microscopy and urine culture.  Also check CK and ANA.  If all this is negative will likely need referral to urology.  Dehydration  Elevated specific gravity on UA today as well.  Encourage good oral hydration.    Subjective:  HPI:  Patient is here today for follow-up.  He was seen 1 week ago with constellation of symptoms including headache and dizziness in addition to fatigue.  Work-up at that time was significant for hematuria with elevated specific gravity and urine but otherwise normal CBC, C met, TSH, sed rate, and CRP.  He was additionally having nystagmus and we obtained a CT head at that time as well.  This was negative.  He was found to have bibasilar rales on physical exam last week.   Unfortunately symptoms have not changed since his last visit.  Still has had ongoing headache and fatigue.  Still dizziness with motion.  Symptoms worsened over the last couple days but are slightly better today.  He has been trying to stay well-hydrated.       Objective:  Physical Exam: BP 122/80   Pulse 68   Temp 98 F (36.7 C)   Ht '5\' 9"'$  (1.753 m)   Wt 194 lb (88 kg)   SpO2 97%   BMI 28.65 kg/m   Gen: No  acute distress, resting comfortably HEENT: Bilateral TMs with clear effusion otherwise clear.  OP clear. CV: Regular rate and rhythm with no murmurs appreciated Pulm: Normal work of breathing, bibasilar rales noted. Neuro: Bilateral horizontal nystagmus noted.  Otherwise cranial nerves II through XII intact.  Finger-nose is finger testing intact bilaterally. Psych: Normal affect and thought content  Time Spent: 45 minutes of total time was spent on the date of the encounter performing the following actions: chart review prior to seeing the patient, obtaining history, performing a medically necessary exam, counseling on the treatment plan including further diagnostic work-up including chest x-ray, brain MRI, and urinalysis, placing orders, and documenting in our EHR.        Algis Greenhouse. Jerline Pain, MD 02/13/2020 12:10 PM

## 2020-02-13 NOTE — Patient Instructions (Signed)
It was very nice to see you today!  We still do not have a clear explanation for your symptoms.  You have signs of dehydration and you have a small amount of blood in your urine.  Please make sure that you are getting plenty of fluids.  We will check your blood work today.  I would like to get a chest x-ray to make sure there is nothing going on in your lungs  We will also get a brain MRI.  We will get call to get this scheduled within the next couple of days.  I will send in a short burst of prednisone.  Hopefully this will help some with your symptoms.  We will contact you once the results of your tests are back.    Take care, Dr Jerline Pain  Please try these tips to maintain a healthy lifestyle:   Eat at least 3 REAL meals and 1-2 snacks per day.  Aim for no more than 5 hours between eating.  If you eat breakfast, please do so within one hour of getting up.    Each meal should contain half fruits/vegetables, one quarter protein, and one quarter carbs (no bigger than a computer mouse)   Cut down on sweet beverages. This includes juice, soda, and sweet tea.     Drink at least 1 glass of water with each meal and aim for at least 8 glasses per day   Exercise at least 150 minutes every week.

## 2020-02-15 NOTE — Progress Notes (Signed)
Please inform patient of the following:  His send out urine sample is NORMAL. Do not need to do any further tests regarding his urine.  His xray showed some inflammation in his lungs - do not think this would cause his dizziness or headaches but could explain some of his fatigue. We have him on prednisone which should be treating this, but we still need to get the MRI to rule out other causes of his headache and dizziness.

## 2020-02-16 LAB — URINE CULTURE
MICRO NUMBER:: 10652755
Result:: NO GROWTH
SPECIMEN QUALITY:: ADEQUATE

## 2020-02-16 LAB — ANA: Anti Nuclear Antibody (ANA): NEGATIVE

## 2020-02-20 ENCOUNTER — Other Ambulatory Visit: Payer: Self-pay | Admitting: Family Medicine

## 2020-02-20 ENCOUNTER — Other Ambulatory Visit: Payer: Self-pay

## 2020-02-20 DIAGNOSIS — Z77018 Contact with and (suspected) exposure to other hazardous metals: Secondary | ICD-10-CM

## 2020-02-20 DIAGNOSIS — H5500 Unspecified nystagmus: Secondary | ICD-10-CM

## 2020-02-20 NOTE — Addendum Note (Signed)
Addended by: Loralyn Freshwater on: 02/20/2020 04:34 PM   Modules accepted: Orders

## 2020-02-21 ENCOUNTER — Other Ambulatory Visit: Payer: Self-pay

## 2020-02-21 ENCOUNTER — Ambulatory Visit
Admission: RE | Admit: 2020-02-21 | Discharge: 2020-02-21 | Disposition: A | Discharge status: Home / Self Care | Payer: Commercial Managed Care - PPO | Source: Ambulatory Visit | Attending: Family Medicine | Admitting: Family Medicine

## 2020-02-21 ENCOUNTER — Telehealth: Payer: Self-pay | Admitting: Family Medicine

## 2020-02-21 DIAGNOSIS — H5500 Unspecified nystagmus: Secondary | ICD-10-CM

## 2020-02-21 DIAGNOSIS — Z77018 Contact with and (suspected) exposure to other hazardous metals: Secondary | ICD-10-CM

## 2020-02-21 NOTE — Telephone Encounter (Signed)
Can we call and check on patient? We can send in rx for vallium if needed. I would like to get the MRI if at all possible.  If he does not think he is able to, recommend he come in for OV soon to discuss alternatives.

## 2020-02-21 NOTE — Telephone Encounter (Signed)
Please see message. °

## 2020-02-21 NOTE — Telephone Encounter (Signed)
Patient is calling in this afternoon stating that he went for the MRI and was unable to do it, was claustrophobic.

## 2020-02-21 NOTE — Telephone Encounter (Signed)
Patient will schedule a office visit to discuss alternatives

## 2020-02-22 ENCOUNTER — Other Ambulatory Visit: Payer: Commercial Managed Care - PPO

## 2020-02-25 ENCOUNTER — Other Ambulatory Visit: Payer: Self-pay

## 2020-02-25 ENCOUNTER — Ambulatory Visit (INDEPENDENT_AMBULATORY_CARE_PROVIDER_SITE_OTHER): Payer: Commercial Managed Care - PPO | Admitting: Family Medicine

## 2020-02-25 ENCOUNTER — Encounter: Payer: Self-pay | Admitting: Family Medicine

## 2020-02-25 VITALS — BP 122/72 | HR 72 | Temp 97.7°F | Ht 69.0 in | Wt 193.2 lb

## 2020-02-25 DIAGNOSIS — H5500 Unspecified nystagmus: Secondary | ICD-10-CM

## 2020-02-25 DIAGNOSIS — R0989 Other specified symptoms and signs involving the circulatory and respiratory systems: Secondary | ICD-10-CM | POA: Diagnosis not present

## 2020-02-25 MED ORDER — DICLOFENAC SODIUM 75 MG PO TBEC
75.0000 mg | DELAYED_RELEASE_TABLET | Freq: Two times a day (BID) | ORAL | 0 refills | Status: DC | PRN
Start: 2020-02-25 — End: 2020-12-09

## 2020-02-25 MED ORDER — DIAZEPAM 5 MG PO TABS: ORAL_TABLET | ORAL | 1 refills | Status: DC

## 2020-02-25 NOTE — Progress Notes (Addendum)
   Joseph [name] is a 59 y.o. male who presents today for an office visit.  Assessment/Plan:  Nystagmus/headache Unfortunately patient was not able to have MRI.  Symptoms are stable today.  We will try to arrange for open MRI to rule out malignancy and further evaluate for any other possible causes of his symptoms. May need preprocedure sedation with Valium-this prescription was sent in as well.  Will send in diclofenac to use as needed for headache.  May ultimately need referral to ENT or neurology depending on MRI results  Rales Improved compared to last physical exam status post prednisone burst.  We will need to repeat chest x-ray in 1 to 2 months.    Subjective:  HPI:  Patient here for follow-up.  Was last seen 12 days ago.  At that time was having ongoing issues with nystagmus and headache.  Blood work was essentially normal.  We try to obtain a brain MRI.  He was not able to have this done due to severe claustrophobia.  He was found to have some bronchitis on a chest x-ray at that time as well.  We started him on a prednisone burst.  Overall his symptoms are about the same.  Still has quite a bit of dizziness.  Still has headache as well.  Symptoms are worse with certain motion and movement.  No new weakness or numbness.  Overall symptoms are about the same.       Objective:  Physical Exam: BP 122/72   Pulse 72   Temp 97.7 F (36.5 C)   Ht 5\' 9"  (1.753 m)   Wt 193 lb 3.2 oz (87.6 kg)   SpO2 96%   BMI 28.53 kg/m   Gen: No acute distress, resting comfortably CV: Regular rate and rhythm with no murmurs appreciated Pulm: Normal work of breathing.  Bibasilar crackles noted though improved compared to previous exam. Neuro: Bilateral horizontal nystagmus noted otherwise cranial nerves II through XII intact. Psych: Normal affect and thought content      Aireonna Bauer M. Jerline Pain, MD 02/25/2020 11:06 AM

## 2020-02-25 NOTE — Addendum Note (Signed)
Addended by: Loralyn Freshwater on: 02/25/2020 04:00 PM   Modules accepted: Orders

## 2020-02-25 NOTE — Addendum Note (Signed)
Addended by: Vivi Barrack on: 02/25/2020 11:06 AM   Modules accepted: Level of Service

## 2020-02-25 NOTE — Patient Instructions (Signed)
It was very nice to see you today!  We will try to schedule you for an open MRI.  You can call [facility] to see if they have any available slots.  We will also call over their office.  Please use the diclofenac as needed for headaches.  I will send in Valium as well.  You can use as needed prior to your procedure.  Recheck recheck a chest x-ray at some point in the next month or 2.  Take care, Dr Jerline Pain  Please try these tips to maintain a healthy lifestyle:   Eat at least 3 REAL meals and 1-2 snacks per day.  Aim for no more than 5 hours between eating.  If you eat breakfast, please do so within one hour of getting up.    Each meal should contain half fruits/vegetables, one quarter protein, and one quarter carbs (no bigger than a computer mouse)   Cut down on sweet beverages. This includes juice, soda, and sweet tea.     Drink at least 1 glass of water with each meal and aim for at least 8 glasses per day   Exercise at least 150 minutes every week.

## 2020-03-06 ENCOUNTER — Ambulatory Visit
Admission: RE | Admit: 2020-03-06 | Discharge: 2020-03-06 | Disposition: A | Discharge status: Home / Self Care | Payer: Commercial Managed Care - PPO | Source: Ambulatory Visit | Attending: Family Medicine | Admitting: Family Medicine

## 2020-03-06 ENCOUNTER — Other Ambulatory Visit: Payer: Self-pay

## 2020-03-06 DIAGNOSIS — H5500 Unspecified nystagmus: Secondary | ICD-10-CM

## 2020-03-17 ENCOUNTER — Other Ambulatory Visit: Payer: Commercial Managed Care - PPO

## 2020-12-05 IMAGING — DX DG CHEST 2V
2 series · 2 of 2 positions shown · non-contrast
Comparison: Chest x-ray 02/27/2013.

CLINICAL DATA: 58-year-old male with history of headache and
dizziness for the past 2-3 weeks.

EXAM:
CHEST - 2 VIEW

[chest pa]
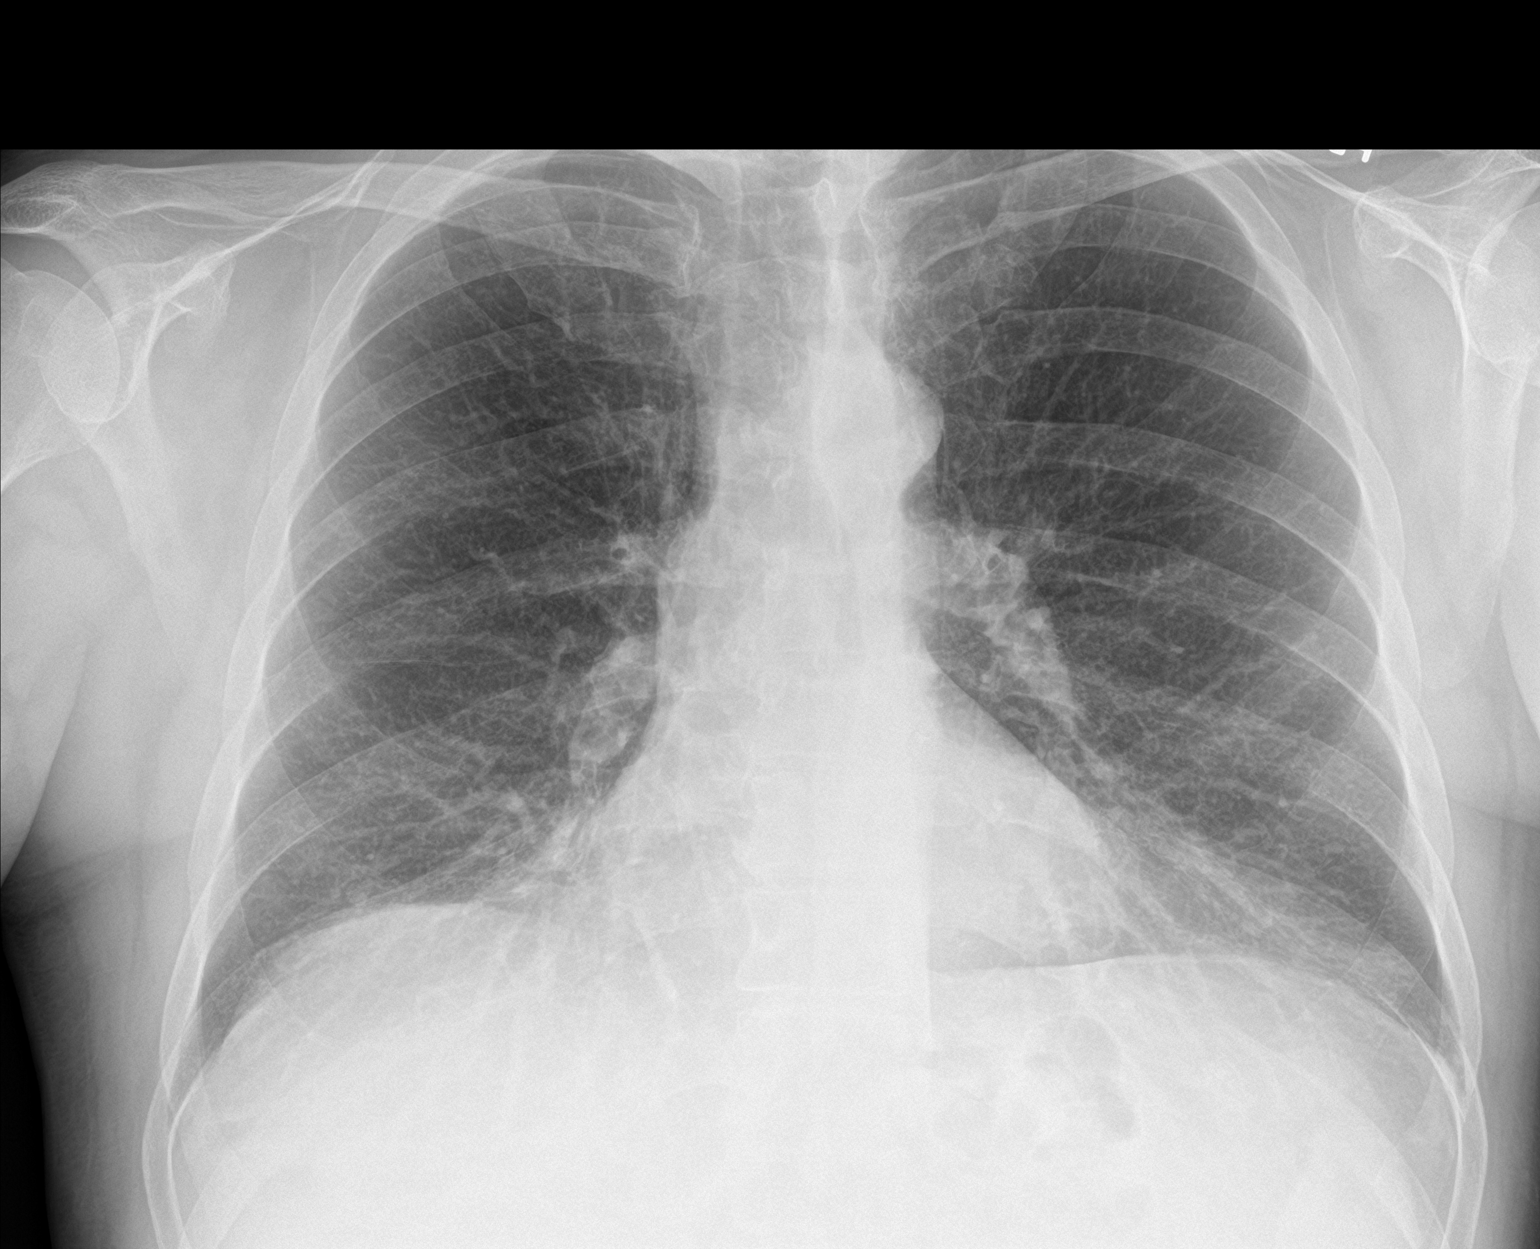

[chest lat]
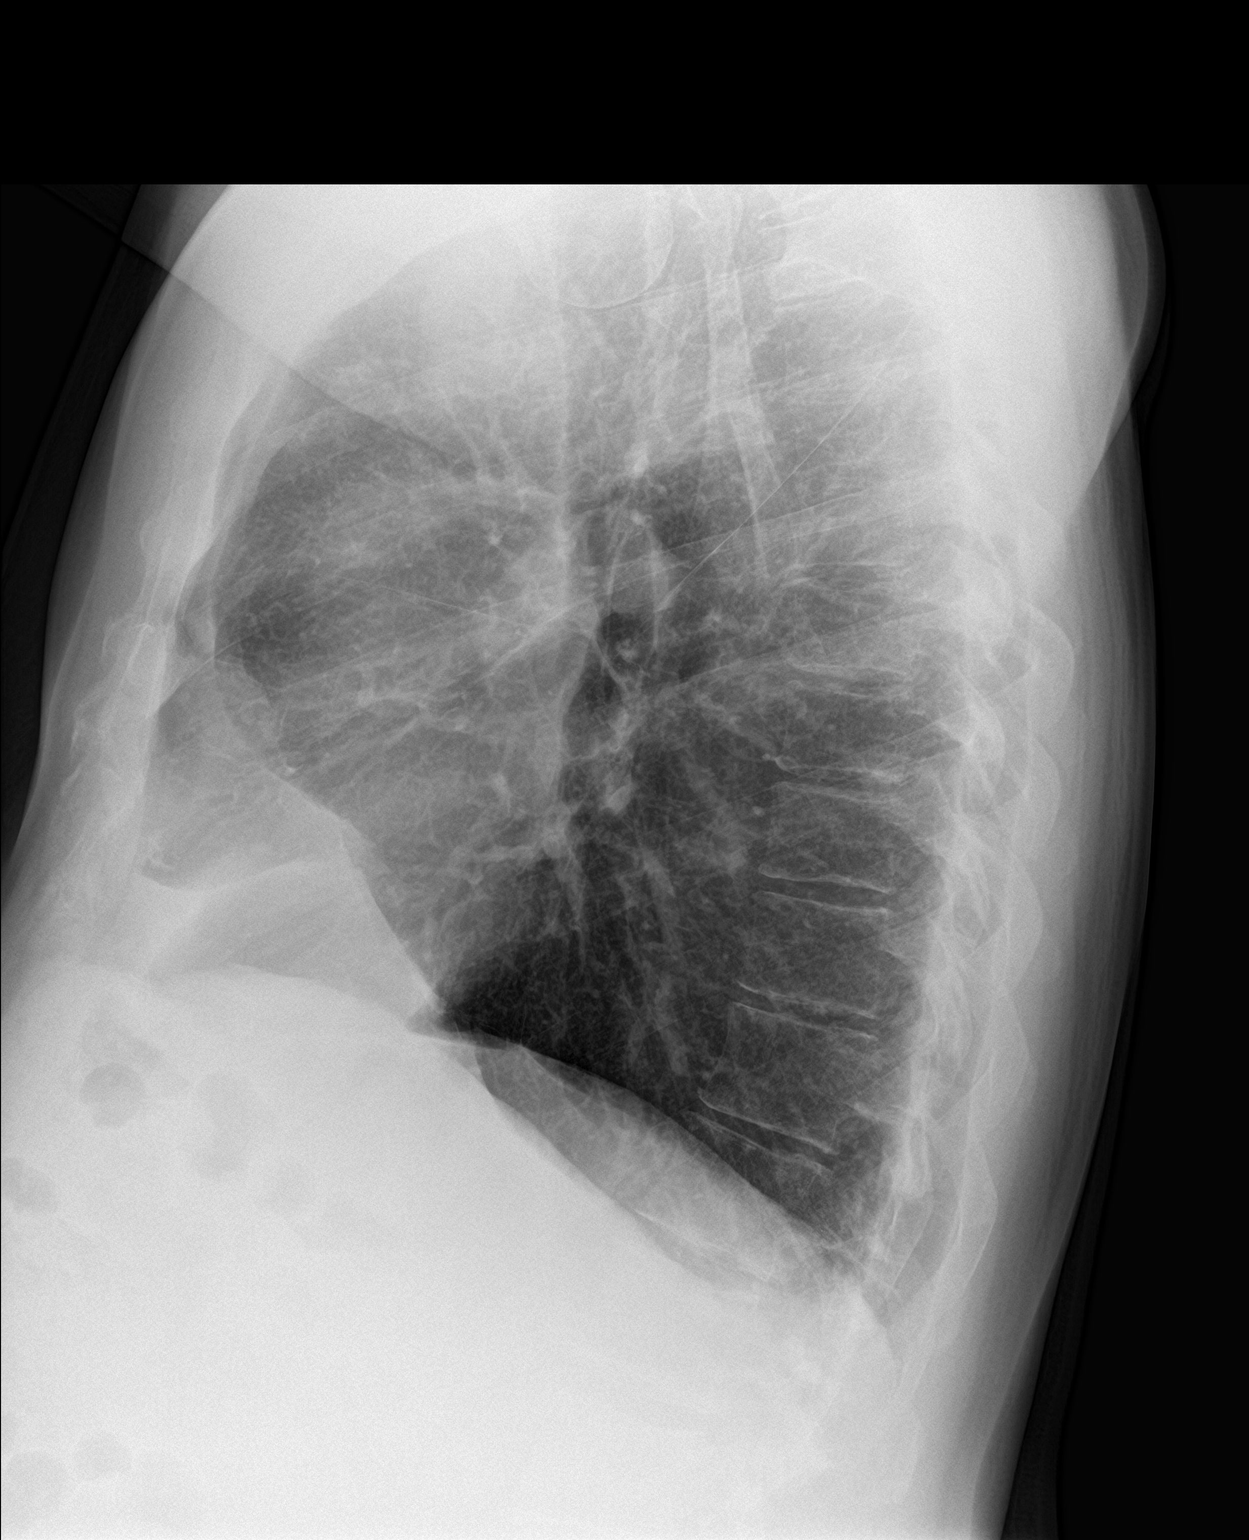

[2 of 2 positions shown; findings below may reference images not displayed]

FINDINGS: Mild interstitial prominence and peribronchial cuffing noted
throughout the lungs bilaterally, most evident throughout the mid to
lower lungs. Lung volumes are normal. No consolidative airspace
disease. No pleural effusions. No pneumothorax. No pulmonary nodule
or mass noted. Pulmonary vasculature and the cardiomediastinal
silhouette are within normal limits. Atherosclerosis in the thoracic
aorta.
IMPRESSION: 1. Mild peribronchial cuffing and interstitial prominence most
evident throughout the mid to lower lungs bilaterally, which could
indicate an underlying bronchitis.
2. Aortic atherosclerosis.

## 2020-12-09 ENCOUNTER — Encounter: Payer: Self-pay | Admitting: Family Medicine

## 2020-12-09 ENCOUNTER — Other Ambulatory Visit: Payer: Self-pay

## 2020-12-09 ENCOUNTER — Ambulatory Visit (INDEPENDENT_AMBULATORY_CARE_PROVIDER_SITE_OTHER): Payer: 59 | Admitting: Family Medicine

## 2020-12-09 VITALS — BP 121/80 | HR 74 | Temp 97.8°F | Ht 69.0 in | Wt 197.4 lb

## 2020-12-09 DIAGNOSIS — M549 Dorsalgia, unspecified: Secondary | ICD-10-CM | POA: Diagnosis not present

## 2020-12-09 DIAGNOSIS — E663 Overweight: Secondary | ICD-10-CM | POA: Diagnosis not present

## 2020-12-09 DIAGNOSIS — D179 Benign lipomatous neoplasm, unspecified: Secondary | ICD-10-CM | POA: Diagnosis not present

## 2020-12-09 DIAGNOSIS — G8929 Other chronic pain: Secondary | ICD-10-CM

## 2020-12-09 DIAGNOSIS — G4700 Insomnia, unspecified: Secondary | ICD-10-CM

## 2020-12-09 MED ORDER — HYDROXYZINE HCL 50 MG PO TABS: 25.0000 mg | ORAL_TABLET | Freq: Every evening | ORAL | 1 refills | Status: DC | PRN

## 2020-12-09 NOTE — Assessment & Plan Note (Signed)
Stable. Hydroxyzine refilled.  

## 2020-12-09 NOTE — Patient Instructions (Signed)
It was very nice to see you today!  I will place a referral from surgeon To take the nighttime to have you back.  I will also place referral to nutritionist.  We will start Ozempic today.  Please take 0.25 mg weekly for the first 4 to 6 weeks.  Please send a message in a few weeks to let me know how it is working for you.  Take care, Dr Jerline Pain  PLEASE NOTE:  If you had any lab tests please let us know if you have not heard back within a few days. You may see your results on mychart before we have a chance to review them but we will give you a call once they are reviewed by [place]. If we ordered any referrals today, please let us know if you have not heard from their office within the next week.   Please try these tips to maintain a healthy lifestyle:   Eat at least 3 REAL meals and 1-2 snacks per day.  Aim for no more than 5 hours between eating.  If you eat breakfast, please do so within one hour of getting up.    Each meal should contain half fruits/vegetables, one quarter protein, and one quarter carbs (no bigger than a computer mouse)   Cut down on sweet beverages. This includes juice, soda, and sweet tea.     Drink at least 1 glass of water with each meal and aim for at least 8 glasses per day   Exercise at least 150 minutes every week.

## 2020-12-09 NOTE — Progress Notes (Signed)
   Joseph Joseph [name] is a 60 y.o. male who presents today for an office visit.  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  Will place referral to surgery for excision.  Chronic Problems Addressed Today: Insomnia Stable.  Hydroxyzine refilled.  Chronic back Joseph [name] Overall stable.  Limits his ability to exercise.  Overweight Discussed lifestyle modifications.  Will place referral to nutrition.  Start Ozempic 0.25 mg weekly.  Discussed potential side effects.  He will follow-up with me in a few weeks via mychart. We can increase the dose as tolerated.     Subjective:  HPI:  See A/P for status of chronic conditions  He has a lump on upper back.  Has been present for quite a while.  Seems to be increasing in size.  Become more painful.  No treatments tried.  He also has had difficulty losing weight.  He travels a lot for work.  Overall feels like he eats healthy.  Exercise is limited.       Objective:  Physical Exam: BP 121/80   Pulse 74   Temp 97.8 F (36.6 C) (Temporal)   Ht 5\' 9"  (1.753 m)   Wt 197 lb 6.4 oz (89.5 kg)   SpO2 95%   BMI 29.15 kg/m   Wt Readings from Last 3 Encounters:  12/09/20 197 lb 6.4 oz (89.5 kg)  02/25/20 193 lb 3.2 oz (87.6 kg)  02/13/20 194 lb (88 kg)  Gen: No acute distress, resting comfortably CV: Regular rate and rhythm with no murmurs appreciated Pulm: Normal work of breathing, clear to auscultation bilaterally with no crackles, wheezes, or rhonchi Skin: Approximately 12cm mobile mass on mid upper back.  Neuro: Grossly normal, moves all extremities Psych: Normal affect and thought content      Joseph Beamon M. Jerline Pain, MD 12/09/2020 9:49 AM

## 2020-12-09 NOTE — Assessment & Plan Note (Signed)
Overall stable.  Limits his ability to exercise.

## 2020-12-09 NOTE — Assessment & Plan Note (Signed)
Discussed lifestyle modifications.  Will place referral to nutrition.  Start Ozempic 0.25 mg weekly.  Discussed potential side effects.  He will follow-up with me in a few weeks via mychart. We can increase the dose as tolerated.

## 2020-12-10 ENCOUNTER — Other Ambulatory Visit: Payer: Self-pay

## 2020-12-10 ENCOUNTER — Encounter: Payer: 59 | Attending: Family Medicine | Admitting: Registered"

## 2020-12-10 ENCOUNTER — Encounter: Payer: Self-pay | Admitting: Registered"

## 2020-12-10 ENCOUNTER — Telehealth: Payer: Self-pay

## 2020-12-10 DIAGNOSIS — E663 Overweight: Secondary | ICD-10-CM | POA: Insufficient documentation

## 2020-12-10 MED ORDER — HYDROXYZINE HCL 50 MG PO TABS: 25.0000 mg | ORAL_TABLET | Freq: Every evening | ORAL | 1 refills | Status: DC | PRN

## 2020-12-10 NOTE — Telephone Encounter (Signed)
Rx sent in

## 2020-12-10 NOTE — Telephone Encounter (Signed)
.   LAST APPOINTMENT DATE: 12/10/2020   NEXT APPOINTMENT DATE:@Visit  date not found  MEDICATION:Semaglutide (OZEMPIC, 0.25 OR 0.5 MG/DOSE, Platea)   PHARMACY:[id number] - [place], [place] - [address]

## 2020-12-10 NOTE — Patient Instructions (Signed)
-   Aim to balance snacks with protein and carbohydrates.   - Aim to eat about every 3-5 hours.   - Aim to increase physical activity to 2 days a week of at least 15-20 min walking.   - Increase vegetable intake with lunch and dinner.

## 2020-12-10 NOTE — Progress Notes (Signed)
Medical Nutrition Therapy  Appointment Start time:  8:48  Appointment End time:  9:45  Primary concerns today: recent weight increase, meal planning for healthy meals  Referral diagnosis: overweight Preferred learning style: no preference indicated Learning readiness: change in progress   NUTRITION ASSESSMENT   Pt states he has gained 4 lbs since last year and feels overweight. Reports increased fatigue. States he travels a lot; comes home about every 6-8 weeks. States he does not exercise as much as he used to due to his work schedule; works 10-14 hours/day.   States he would rather cook when at home. States he plans out meals at times but doesn't do it properly.  States he typically stops eating when full. States he does not eat red meat often; more chicken and [place]. States he eats pasta often because its quick and easy.  States he doesn't eat fast food but will eat at a restaurant. Reports he will grab an extra meal when eating out or take extra meal to work the next day.   States he has not weighed this much in his life. Gets tired when walking at times. States he does not make a lot of vegetables because it will be wasted with the people who are in his house while away for work.    Pt expectations: wants to learn how to eat properly. Does not want to add additional weight. Wants to lose weight. Wants to get beyond texture issues he has with food.    Clinical Medical Hx: none Medications: See list Labs: none Notable Signs/Symptoms: increased fatigue when he doesn't eat properly  Lifestyle & Dietary Hx  Estimated daily fluid intake: 64 oz Supplements: See list Sleep: about 6 hrs/night; states it depends on work schedule Stress / self-care: does not let things bother him 95% of the time Current average weekly physical activity: sometimes climbing, walking though airports at times  24-Hr Dietary Recall First Meal (9 am): sometimes skips; fried bologna sandwich Snack:  Second  Meal (3:30 pm): sometimes skips; potatoes + sausage or hamburger helper or chicken nuggets + chips  Snack:  Third Meal: restaurant or home-cooked burger patty + hash browns  Snack (6 pm): 2-3 chips Snack (8 pm): cookie Beverages: [place] juice (32-40 oz), water (1-2*16 oz; 32 oz), alcohol (rarely)   NUTRITION DIAGNOSIS  NB-1.1 Food and nutrition-related knowledge deficit As related to lack of prior nutrition-related education.  As evidenced by pt verbalizes incomplete information.   NUTRITION INTERVENTION  Nutrition education (E-1) on the following topics:  Nutrition education and counseling. Pt was educated on the benefits of eating a variety of food groups at each meal. Discussed the purpose of each food group and ways to create balance with already established regimen. Discussed eating every 3-5 hours to help adequately nourish body. Discussed importance of physical activity. Pt agreed with goals listed  Handouts Provided Include   Start Simple with MyPlate Mini Poster  Healthy Snacking with MyPlate  Learning Style & Readiness for Change Teaching method utilized: Visual & Auditory  Demonstrated degree of understanding via: Teach Back  Barriers to learning/adherence to lifestyle change: work-life balance  Goals Established by Pt  Aim to balance snacks with protein and carbohydrates.   Aim to eat about every 3-5 hours.   Aim to increase physical activity to 2 days a week of at least 15-20 min walking.   Increase vegetable intake with lunch and dinner.    MONITORING & EVALUATION Dietary intake, weekly physical activity, in  1 month.  Next Steps  Patient is to follow-up in 1 month.

## 2020-12-10 NOTE — Telephone Encounter (Signed)
.   LAST APPOINTMENT DATE: 12/09/2020   NEXT APPOINTMENT DATE:@Visit  date not found  MEDICATION: hydrOXYzine (ATARAX/VISTARIL) 50 MG tablet   PHARMACY:[id number] - [place], [place] - [address]   Patient states he was told he had two prescriptions to pick up at [facility] but they do not except his insurance so [facility] but I only see one in his chart  Patient could not give me any information as the name of second medication

## 2020-12-11 ENCOUNTER — Other Ambulatory Visit: Payer: Self-pay | Admitting: *Deleted

## 2020-12-11 NOTE — Telephone Encounter (Signed)
Ok with me. Please place any necessary orders. 

## 2020-12-11 NOTE — Telephone Encounter (Signed)
Ok to refills Rx Ozempic? Last refill done by historical provider

## 2020-12-12 ENCOUNTER — Other Ambulatory Visit: Payer: Self-pay

## 2020-12-12 MED ORDER — OZEMPIC (0.25 OR 0.5 MG/DOSE) 2 MG/1.5ML ~~LOC~~ SOPN: 0.2500 mg | PEN_INJECTOR | SUBCUTANEOUS | 0 refills | Status: AC

## 2020-12-12 NOTE — Telephone Encounter (Signed)
Rx sent in

## 2021-01-15 ENCOUNTER — Encounter: Payer: 59 | Attending: Family Medicine | Admitting: Registered"

## 2021-01-15 DIAGNOSIS — E663 Overweight: Secondary | ICD-10-CM | POA: Insufficient documentation

## 2021-02-20 ENCOUNTER — Other Ambulatory Visit: Payer: Self-pay | Admitting: Surgery

## 2021-04-16 NOTE — Pre-Procedure Instructions (Addendum)
Surgical Instructions    Your procedure is scheduled on Thursday, April 23, 2021 at 9:00 AM.  Report to [facility] Main Entrance "A" at 7:00 A.M., then check in with the Admitting office.  Call this number if you have problems the morning of surgery:  [phone]   If you have any questions prior to your surgery date call [phone]: Open Monday-Friday 8am-4pm    Remember:  Do not eat after midnight the night before your surgery  You may drink clear liquids until 6:00 AM the morning of your surgery.   Clear liquids allowed are: Water, Non-Citrus Juices (without pulp), Carbonated Beverages, Clear Tea, Black Coffee ONLY (NO MILK, CREAM OR POWDERED CREAMER of any kind), and Gatorade    Please complete your PRE-SURGERY ENSURE that was provided to you by 6:00 AM the morning of surgery.  Please, if able, drink it in one setting. DO NOT SIP.    Do no take any medications the morning of surgery.    As of today, STOP taking any Aspirin (unless otherwise instructed by your surgeon) Aleve, Naproxen, Ibuprofen, Motrin, Advil, Goody's, BC's, all herbal medications, fish oil, and all vitamins.          Do not wear jewelry. Do not wear lotions, powders, colognes, or deodorant. Do not shave 48 hours prior to surgery. Do not bring valuables to the hospital.              [facility] is not responsible for any belongings or valuables.  Do NOT Smoke (Tobacco/Vaping) or drink Alcohol 24 hours prior to your procedure If you use a CPAP at night, you may bring all equipment for your overnight stay.   Contacts, glasses, dentures or bridgework may not be worn into surgery, please bring cases for these belongings   For patients admitted to the hospital, discharge time will be determined by your treatment team.   Patients discharged the day of surgery will not be allowed to drive home, and someone needs to stay with them for 24 hours.  ONLY 1 SUPPORT PERSON MAY BE PRESENT WHILE YOU ARE IN  SURGERY. IF YOU ARE TO BE ADMITTED ONCE YOU ARE IN YOUR ROOM YOU WILL BE ALLOWED TWO (2) VISITORS.  Minor children may have two parents present. Special consideration for safety and communication needs will be reviewed on a case by case basis.  Special instructions:    Oral Hygiene is also important to reduce your risk of infection.  Remember - BRUSH YOUR TEETH THE MORNING OF SURGERY WITH YOUR REGULAR TOOTHPASTE   [place]- Preparing For Surgery  Before surgery, you can play an important role. Because skin is not sterile, your skin needs to be as free of germs as possible. You can reduce the number of germs on your skin by washing with CHG (chlorahexidine gluconate) Soap before surgery.  CHG is an antiseptic cleaner which kills germs and bonds with the skin to continue killing germs even after washing.     Please do not use if you have an allergy to CHG or antibacterial soaps. If your skin becomes reddened/irritated stop using the CHG.  Do not shave (including legs and underarms) for at least 48 hours prior to first CHG shower. It is OK to shave your face.  Please follow these instructions carefully.     Shower the NIGHT BEFORE SURGERY and the MORNING OF SURGERY with CHG Soap.   If you chose to wash your hair, wash your hair first as usual with  your normal shampoo. After you shampoo, rinse your hair and body thoroughly to remove the shampoo.  Then [employer] and genitals (private parts) with your normal soap and rinse thoroughly to remove soap.  After that Use CHG Soap as you would any other liquid soap. You can apply CHG directly to the skin and wash gently with a scrungie or a clean washcloth.   Apply the CHG Soap to your body ONLY FROM THE NECK DOWN.  Do not use on open wounds or open sores. Avoid contact with your eyes, ears, mouth and genitals (private parts). Wash Face and genitals (private parts)  with your normal soap.   Wash thoroughly, paying special attention to the area where  your surgery will be performed.  Thoroughly rinse your body with warm water from the neck down.  DO NOT shower/wash with your normal soap after using and rinsing off the CHG Soap.  Pat yourself dry with a CLEAN TOWEL.  Wear CLEAN PAJAMAS to bed the night before surgery  Place CLEAN SHEETS on your bed the night before your surgery  DO NOT SLEEP WITH PETS.   Day of Surgery:  Take a shower with CHG soap. Wear Clean/Comfortable clothing the morning of surgery Do not apply any deodorants/lotions.   Remember to brush your teeth WITH YOUR REGULAR TOOTHPASTE.   Please read over the following fact sheets that you were given.

## 2021-04-17 ENCOUNTER — Encounter (HOSPITAL_COMMUNITY)
Admission: RE | Admit: 2021-04-17 | Discharge: 2021-04-17 | Disposition: A | Discharge status: Home / Self Care | Payer: 59 | Source: Ambulatory Visit | Attending: Surgery | Admitting: Surgery

## 2021-04-17 ENCOUNTER — Encounter (HOSPITAL_COMMUNITY): Payer: Self-pay

## 2021-04-17 ENCOUNTER — Other Ambulatory Visit: Payer: Self-pay

## 2021-04-17 DIAGNOSIS — Z01812 Encounter for preprocedural laboratory examination: Secondary | ICD-10-CM | POA: Insufficient documentation

## 2021-04-17 HISTORY — DX: Pneumonia, unspecified organism: J18.9

## 2021-04-17 LAB — CBC
HCT: 49.9 % (ref 39.0–52.0)
Hemoglobin: 17 g/dL (ref 13.0–17.0)
MCH: 31.7 pg (ref 26.0–34.0)
MCHC: 34.1 g/dL (ref 30.0–36.0)
MCV: 93.1 fL (ref 80.0–100.0)
Platelets: 272 10*3/uL (ref 150–400)
RBC: 5.36 MIL/uL (ref 4.22–5.81)
RDW: 13 % (ref 11.5–15.5)
WBC: 7 10*3/uL (ref 4.0–10.5)
nRBC: 0 % (ref 0.0–0.2)

## 2021-04-17 NOTE — Progress Notes (Signed)
BP in PAT 136/100 on arrival. Patient with no complaints, no acte distress. Before patient left BP was rechecked - 130/100. Patient verbalized that usually his BP is 120/80. Jeneen Rinks, PA-C was notified.

## 2021-04-17 NOTE — Progress Notes (Signed)
PCP - Dimas Chyle, MD Cardiologist - denies  PPM/ICD - denies Device Orders - N/A Rep Notified - N/A  Chest x-ray - N/A EKG - N/A Stress Test - denies ECHO - denies Cardiac Cath - denies  Sleep Study - denies CPAP - N/A  Fasting Blood Sugar - N/A  Blood Thinner Instructions: N/A  Aspirin Instructions: Patient was instructed: As of today, STOP taking any Aspirin (unless otherwise instructed by your surgeon) Aleve, Naproxen, Ibuprofen, Motrin, Advil, Goody's, BC's, all herbal medications, fish oil, and all vitamins.  ERAS Protcol - yes PRE-SURGERY Ensure - yes  COVID TEST- no - ambulatory surgery   Anesthesia review: no  Patient denies shortness of breath, fever, cough and chest pain at PAT appointment   All instructions explained to the patient, with a verbal understanding of the material. Patient agrees to go over the instructions while at home for a better understanding. Patient also instructed to self quarantine after being tested for COVID-19. The opportunity to ask questions was provided.

## 2021-04-22 NOTE — H&P (Signed)
REFERRING PHYSICIAN:  Vivi Barrack, MD   PROVIDER:  Beverlee Nims, MD   MRN: [id number] DOB: [birth year]    Subjective    Chief Complaint: Cyst (Mass on mid upper back )       History of Present Illness: Joseph [name] is a 60 y.o. male who is seen today as an office consultation at the request of Dr. Jerline Pain for evaluation of Cyst (Mass on mid upper back ) .     This is a pleasant 60 year old gentleman who was referred here for evaluation of a mass on his upper back.  He reports that it only became symptomatic about 6 months ago.  He is uncertain of how long it has been present.  It does cause discomfort and is uncomfortable when he lies on his back.  He denies trauma to the area.  It has never drained.  He had a similar small mass removed on his chest in the 1980s.  He is otherwise healthy without complaints.  He has no other medical issues     Review of Systems: A complete review of systems was obtained from the patient.  I have reviewed this information and discussed as appropriate with the patient.  See HPI as well for other ROS.   ROS      Medical History: History reviewed. No pertinent past medical history.   There is no problem list on file for this patient.     History reviewed. No pertinent surgical history.    Not on File   No current outpatient medications on file prior to visit.    No current facility-administered medications on file prior to visit.           Family History  Problem Relation Age of Onset   Breast cancer Sister        Social History       Tobacco Use  Smoking Status Current Every Day Smoker  Smokeless Tobacco Never Used      Social History         Socioeconomic History   Marital status: Married  Tobacco Use   Smoking status: Current Every Day Smoker   Smokeless tobacco: Never Used  Scientific laboratory technician Use: Never used  Substance and Sexual Activity   Alcohol use: Yes   Drug use: Never   Sexual activity: Yes       Partners: Female      Objective:      There were no vitals filed for this visit.  There is no height or weight on file to calculate BMI.   Physical Exam    He appears well on exam.   There is a 7 to 8 cm mass on his upper back.  It is soft.  It is slightly mobile.  There are no skin changes.  It is nonpulsatile  Lungs clear  CV RRR  Abdomen soft, NT     Labs, Imaging and Diagnostic Testing:     Assessment and Plan:  Diagnoses and all orders for this visit:   Mass on back       I have discussed the mass on the back with the patient and his family.  Because this is a large mass and he is having symptoms, surgical excision is recommended also for histologic evaluation to rule out malignancy.  This may just represent a lipoma or sebaceous cyst but it is difficult to tell and it is quite large.  Again, it measures  approximate 7 to 8 cm in size.  I discussed surgical procedure with him in detail.  We discussed the risk which includes but is not limited to bleeding, infection, recurrence, seroma formation postoperatively, cardiopulmonary issues, etc.  We also discussed his postoperative recovery.  He understands and wished to proceed with surgery as soon as possible

## 2021-04-23 ENCOUNTER — Encounter (HOSPITAL_COMMUNITY)
Admission: RE | Disposition: A | Discharge status: Home / Self Care | Payer: Self-pay | Source: Home / Self Care | Attending: Surgery

## 2021-04-23 ENCOUNTER — Ambulatory Visit (HOSPITAL_COMMUNITY): Payer: 59 | Admitting: Certified Registered"

## 2021-04-23 ENCOUNTER — Ambulatory Visit (HOSPITAL_COMMUNITY): Payer: 59 | Admitting: Physician Assistant

## 2021-04-23 ENCOUNTER — Other Ambulatory Visit: Payer: Self-pay

## 2021-04-23 ENCOUNTER — Ambulatory Visit (HOSPITAL_COMMUNITY)
Admission: RE | Admit: 2021-04-23 | Discharge: 2021-04-23 | Disposition: A | Discharge status: Home / Self Care | Payer: 59 | Attending: Surgery | Admitting: Surgery

## 2021-04-23 ENCOUNTER — Encounter (HOSPITAL_COMMUNITY): Payer: Self-pay | Admitting: Surgery

## 2021-04-23 DIAGNOSIS — F17200 Nicotine dependence, unspecified, uncomplicated: Secondary | ICD-10-CM | POA: Diagnosis not present

## 2021-04-23 DIAGNOSIS — D171 Benign lipomatous neoplasm of skin and subcutaneous tissue of trunk: Secondary | ICD-10-CM | POA: Diagnosis not present

## 2021-04-23 DIAGNOSIS — R222 Localized swelling, mass and lump, trunk: Secondary | ICD-10-CM | POA: Diagnosis present

## 2021-04-23 HISTORY — PX: LIPOMA EXCISION: SHX5283

## 2021-04-23 SURGERY — EXCISION LIPOMA
Anesthesia: Monitor Anesthesia Care | Site: Back

## 2021-04-23 MED ORDER — ONDANSETRON HCL 4 MG/2ML IJ SOLN
INTRAMUSCULAR | Status: DC | PRN
  Administered 2021-04-23: 4 mg via INTRAVENOUS

## 2021-04-23 MED ORDER — ORAL CARE MOUTH RINSE: 15.0000 mL | Freq: Once | OROMUCOSAL | Status: AC

## 2021-04-23 MED ORDER — PROPOFOL 10 MG/ML IV BOLUS
INTRAVENOUS | Status: DC | PRN
  Administered 2021-04-23: 70 mg via INTRAVENOUS

## 2021-04-23 MED ORDER — CHLORHEXIDINE GLUCONATE 0.12 % MT SOLN
15.0000 mL | Freq: Once | OROMUCOSAL | Status: AC
  Administered 2021-04-23: 15 mL via OROMUCOSAL
  Filled 2021-04-23: qty 15

## 2021-04-23 MED ORDER — OXYCODONE HCL 5 MG PO TABS: 5.0000 mg | ORAL_TABLET | Freq: Four times a day (QID) | ORAL | 0 refills | Status: DC | PRN

## 2021-04-23 MED ORDER — PROPOFOL 500 MG/50ML IV EMUL
INTRAVENOUS | Status: DC | PRN
  Administered 2021-04-23: 150 ug/kg/min via INTRAVENOUS

## 2021-04-23 MED ORDER — MIDAZOLAM HCL 2 MG/2ML IJ SOLN
INTRAMUSCULAR | Status: AC
  Filled 2021-04-23: qty 2

## 2021-04-23 MED ORDER — FENTANYL CITRATE (PF) 100 MCG/2ML IJ SOLN
INTRAMUSCULAR | Status: AC
  Filled 2021-04-23: qty 2

## 2021-04-23 MED ORDER — LIDOCAINE-EPINEPHRINE 1 %-1:100000 IJ SOLN
INTRAMUSCULAR | Status: DC | PRN
  Administered 2021-04-23: 10 mL

## 2021-04-23 MED ORDER — 0.9 % SODIUM CHLORIDE (POUR BTL) OPTIME
TOPICAL | Status: DC | PRN
  Administered 2021-04-23: 1000 mL

## 2021-04-23 MED ORDER — CHLORHEXIDINE GLUCONATE CLOTH 2 % EX PADS: 6.0000 | MEDICATED_PAD | Freq: Once | CUTANEOUS | Status: DC

## 2021-04-23 MED ORDER — LACTATED RINGERS IV SOLN: INTRAVENOUS | Status: DC

## 2021-04-23 MED ORDER — FENTANYL CITRATE (PF) 100 MCG/2ML IJ SOLN
INTRAMUSCULAR | Status: DC | PRN
  Administered 2021-04-23 (×4): 25 ug via INTRAVENOUS

## 2021-04-23 MED ORDER — CEFAZOLIN SODIUM-DEXTROSE 2-4 GM/100ML-% IV SOLN
2.0000 g | INTRAVENOUS | Status: AC
  Administered 2021-04-23: 2 g via INTRAVENOUS
  Filled 2021-04-23: qty 100

## 2021-04-23 MED ORDER — ENSURE PRE-SURGERY PO LIQD: 296.0000 mL | Freq: Once | ORAL | Status: DC

## 2021-04-23 MED ORDER — LIDOCAINE-EPINEPHRINE 1 %-1:100000 IJ SOLN
INTRAMUSCULAR | Status: AC
  Filled 2021-04-23: qty 1

## 2021-04-23 MED ORDER — ACETAMINOPHEN 500 MG PO TABS
1000.0000 mg | ORAL_TABLET | ORAL | Status: AC
  Administered 2021-04-23: 1000 mg via ORAL
  Filled 2021-04-23: qty 2

## 2021-04-23 MED ORDER — MIDAZOLAM HCL 5 MG/5ML IJ SOLN
INTRAMUSCULAR | Status: DC | PRN
Start: 2021-04-23 — End: 2021-04-23
  Administered 2021-04-23: 2 mg via INTRAVENOUS

## 2021-04-23 SURGICAL SUPPLY — 31 items
BAG COUNTER SPONGE SURGICOUNT (BAG) IMPLANT
CANISTER SUCT 3000ML PPV (MISCELLANEOUS) IMPLANT
COVER SURGICAL LIGHT HANDLE (MISCELLANEOUS) ×2 IMPLANT
DECANTER SPIKE VIAL GLASS SM (MISCELLANEOUS) IMPLANT
DERMABOND ADVANCED (GAUZE/BANDAGES/DRESSINGS) ×1
DERMABOND ADVANCED .7 DNX12 (GAUZE/BANDAGES/DRESSINGS) ×1 IMPLANT
DRAPE LAPAROSCOPIC ABDOMINAL (DRAPES) IMPLANT
DRAPE LAPAROTOMY 100X72 PEDS (DRAPES) IMPLANT
ELECT REM PT RETURN 9FT ADLT (ELECTROSURGICAL) ×2
ELECTRODE REM PT RTRN 9FT ADLT (ELECTROSURGICAL) ×1 IMPLANT
GAUZE 4X4 16PLY ~~LOC~~+RFID DBL (SPONGE) ×2 IMPLANT
GAUZE SPONGE 4X4 12PLY STRL (GAUZE/BANDAGES/DRESSINGS) ×2 IMPLANT
GLOVE SURG SIGNA 7.5 PF LTX (GLOVE) ×2 IMPLANT
GOWN STRL REUS W/ TWL LRG LVL3 (GOWN DISPOSABLE) ×1 IMPLANT
GOWN STRL REUS W/ TWL XL LVL3 (GOWN DISPOSABLE) ×1 IMPLANT
GOWN STRL REUS W/TWL LRG LVL3 (GOWN DISPOSABLE) ×1
GOWN STRL REUS W/TWL XL LVL3 (GOWN DISPOSABLE) ×1
KIT BASIN OR (CUSTOM PROCEDURE TRAY) ×2 IMPLANT
KIT TURNOVER KIT B (KITS) ×2 IMPLANT
NEEDLE HYPO 25GX1X1/2 BEV (NEEDLE) ×2 IMPLANT
NS IRRIG 1000ML POUR BTL (IV SOLUTION) ×2 IMPLANT
PACK GENERAL/GYN (CUSTOM PROCEDURE TRAY) ×2 IMPLANT
PAD ARMBOARD 7.5X6 YLW CONV (MISCELLANEOUS) ×4 IMPLANT
PENCIL SMOKE EVACUATOR (MISCELLANEOUS) ×2 IMPLANT
SPECIMEN JAR SMALL (MISCELLANEOUS) ×2 IMPLANT
SUT MNCRL AB 4-0 PS2 18 (SUTURE) ×2 IMPLANT
SUT VIC AB 3-0 SH 27 (SUTURE) ×1
SUT VIC AB 3-0 SH 27XBRD (SUTURE) ×1 IMPLANT
SYR CONTROL 10ML LL (SYRINGE) ×2 IMPLANT
TOWEL GREEN STERILE (TOWEL DISPOSABLE) ×2 IMPLANT
TOWEL GREEN STERILE FF (TOWEL DISPOSABLE) ×2 IMPLANT

## 2021-04-23 NOTE — Anesthesia Postprocedure Evaluation (Signed)
Anesthesia Post Note  Patient: Joseph [name]  Procedure(s) Performed: EXCISION BACK MASS (Back)     Patient location during evaluation: PACU Anesthesia Type: MAC Level of consciousness: awake and alert Pain management: pain level controlled Vital Signs Assessment: post-procedure vital signs reviewed and stable Respiratory status: spontaneous breathing and respiratory function stable Cardiovascular status: stable Postop Assessment: no apparent nausea or vomiting Anesthetic complications: no   No notable events documented.  Last Vitals:  Vitals:   04/23/21 0958 04/23/21 1013  BP: (!) 130/91 123/82  Pulse: 81 78  Resp: 19 (!) 22  Temp:    SpO2: 91% 92%    Last Pain:  Vitals:   04/23/21 0943  TempSrc:   PainSc: 0-No pain                 Merlinda Frederick

## 2021-04-23 NOTE — Discharge Instructions (Signed)
Ok to shower starting tomorrow  No vigorous activity for 10 days  Ice pack, tylenol, and ibuprofen also for pain

## 2021-04-23 NOTE — Op Note (Signed)
Joseph [name]  Procedure Note  TORRIEN MORRISEY 04/23/2021   Pre-op Diagnosis: BACK [name]     Post-op Diagnosis: same  Procedure(s): Joseph [name] (7 cm )  Surgeon(s): Coralie Keens, MD  Anesthesia: Monitor Anesthesia Care  Staff:  Circulator: Paulette Blanch, RN Relief Circulator: Glean Hess, RN Scrub Person: Gloriann Loan, RN  Estimated Blood Loss: Minimal               Specimens: sent to path  Findings: Joseph [name] is found to have a [name] measuring approximately 7 cm on his back which appeared consistent with a lipoma.  Procedure: Joseph [name] was brought to operating identifies correct [name].  He is placed upon Joseph operating table and then turned into Joseph left lateral decubitus position.  Anesthesia was induced.  His backs prepped and draped in usual sterile fashion.  I anesthetized Joseph skin over Joseph top of Joseph [name] with lidocaine.  I then made a longitudinal incision with a scalpel.  I then dissected down to Joseph deep subcutaneous tissue with electrocautery.  Joseph [name] had a large [name] which appeared consistent with a lipoma.  I excised it with both blunt dissection and Joseph cautery.  I was able to remove Joseph [name] in its entirety.  Again, it was in Joseph deep subcutaneous tissue.  Joseph [name] was sent to pathology for evaluation.  I anesthetized Joseph incision further with lidocaine.  Hemostasis peer to be achieved.  I then closed Joseph deep subcutaneous tissue with interrupted 2-0 Vicryl sutures.  I then closed more superficially with 3-0 Vicryl sutures and closed Joseph skin with a running 4-0 Monocryl.  Dermabond was then applied.  [name] tolerated procedure well.  All Joseph counts were correct at Joseph end of Joseph procedure.  Joseph [name] was then taken in a stable condition from Joseph operating room to Joseph recovery room.          Coralie Keens   Date: 04/23/2021  Time: 9:38 AM

## 2021-04-23 NOTE — Anesthesia Preprocedure Evaluation (Addendum)
Anesthesia Evaluation  Patient identified by MRN, date of birth, ID band Patient awake    Reviewed: Allergy & Precautions, NPO status , Patient's Chart, lab work & pertinent test results  Airway Mallampati: II  TM Distance: >3 FB Neck ROM: Full    Dental no notable dental hx.    Pulmonary neg pulmonary ROS, former smoker,    Pulmonary exam normal breath sounds clear to auscultation       Cardiovascular negative cardio ROS Normal cardiovascular exam Rhythm:Regular Rate:Normal     Neuro/Psych negative neurological ROS  negative psych ROS   GI/Hepatic negative GI ROS, Neg liver ROS,   Endo/Other  negative endocrine ROS  Renal/GU negative Renal ROS  negative genitourinary   Musculoskeletal negative musculoskeletal ROS (+)   Abdominal   Peds negative pediatric ROS (+)  Hematology negative hematology ROS (+)   Anesthesia Other Findings   Reproductive/Obstetrics negative OB ROS                            Anesthesia Physical Anesthesia Plan  ASA: 2  Anesthesia Plan: MAC   Post-op Pain Management:    Induction:   PONV Risk Score and Plan: 1 and Treatment may vary due to age or medical condition, Propofol infusion, TIVA, Ondansetron and Midazolam  Airway Management Planned: Natural Airway  Additional Equipment: None  Intra-op Plan:   Post-operative Plan:   Informed Consent: I have reviewed the patients History and Physical, chart, labs and discussed the procedure including the risks, benefits and alternatives for the proposed anesthesia with the patient or authorized representative who has indicated his/her understanding and acceptance.     Dental advisory given  Plan Discussed with: CRNA, Anesthesiologist and Surgeon  Anesthesia Plan Comments:        Anesthesia Quick Evaluation

## 2021-04-23 NOTE — Transfer of Care (Signed)
Immediate Anesthesia Transfer of Care Note  Patient: Joseph [name]  Procedure(s) Performed: EXCISION BACK MASS (Back)  Patient Location: PACU  Anesthesia Type:General  Level of Consciousness: awake, alert , oriented and patient cooperative  Airway & Oxygen Therapy: Patient Spontanous Breathing, Patient connected to face mask oxygen and Patient connected to face mask  Post-op Assessment: Report given to RN, Post -op Vital signs reviewed and stable and Patient moving all extremities X 4  Post vital signs: Reviewed and stable  Last Vitals:  Vitals Value Taken Time  BP 114/81 04/23/21 0944  Temp 36.6 C 04/23/21 0943  Pulse 88 04/23/21 0944  Resp 21 04/23/21 0944  SpO2 93 % 04/23/21 0944  Vitals shown include unvalidated device data.  Last Pain:  Vitals:   04/23/21 0729  TempSrc: Oral  PainSc:          Complications: No notable events documented.

## 2021-04-23 NOTE — Interval H&P Note (Signed)
History and Physical Interval Note:no change in H and P  04/23/2021 7:21 AM  Joseph [name]  has presented today for surgery, with the diagnosis of BACK MASS.  The various methods of treatment have been discussed with the patient and family. After consideration of risks, benefits and other options for treatment, the patient has consented to  Procedure(s): EXCISION BACK MASS (N/A) as a surgical intervention.  The patient's history has been reviewed, patient examined, no change in status, stable for surgery.  I have reviewed the patient's chart and labs.  Questions were answered to the patient's satisfaction.     Coralie Keens

## 2021-04-23 NOTE — Anesthesia Procedure Notes (Signed)
Procedure Name: MAC Date/Time: 04/23/2021 9:32 AM Performed by: Annamary Carolin, CRNA Pre-anesthesia Checklist: Patient identified, Emergency Drugs available, Suction available and Patient being monitored Patient Re-evaluated:Patient Re-evaluated prior to induction Preoxygenation: Pre-oxygenation with 100% oxygen Induction Type: IV induction

## 2021-04-24 ENCOUNTER — Encounter (HOSPITAL_COMMUNITY): Payer: Self-pay | Admitting: Surgery

## 2021-04-24 LAB — SURGICAL PATHOLOGY

## 2022-01-08 ENCOUNTER — Encounter: Payer: 59 | Admitting: Family Medicine

## 2023-04-01 ENCOUNTER — Telehealth: Payer: Self-pay | Admitting: Family Medicine

## 2023-04-01 NOTE — Telephone Encounter (Signed)
Patient states will call back because he travels often and unfortunately we did not have anything available the week that he would have liked to come.

## 2023-06-16 ENCOUNTER — Ambulatory Visit (INDEPENDENT_AMBULATORY_CARE_PROVIDER_SITE_OTHER): Payer: Managed Care, Other (non HMO)

## 2023-06-16 DIAGNOSIS — Z23 Encounter for immunization: Secondary | ICD-10-CM | POA: Diagnosis not present

## 2023-07-12 ENCOUNTER — Encounter: Payer: 59 | Admitting: Family Medicine

## 2023-10-21 ENCOUNTER — Other Ambulatory Visit: Payer: Self-pay | Admitting: Family Medicine

## 2023-10-21 ENCOUNTER — Telehealth: Payer: Self-pay | Admitting: *Deleted

## 2023-10-21 ENCOUNTER — Encounter: Payer: Self-pay | Admitting: Family Medicine

## 2023-10-21 ENCOUNTER — Ambulatory Visit: Admitting: Family Medicine

## 2023-10-21 VITALS — BP 130/80 | HR 77 | Temp 98.2°F | Ht 68.0 in | Wt 196.0 lb

## 2023-10-21 DIAGNOSIS — G8929 Other chronic pain: Secondary | ICD-10-CM

## 2023-10-21 DIAGNOSIS — E663 Overweight: Secondary | ICD-10-CM

## 2023-10-21 DIAGNOSIS — Z131 Encounter for screening for diabetes mellitus: Secondary | ICD-10-CM

## 2023-10-21 DIAGNOSIS — Z125 Encounter for screening for malignant neoplasm of prostate: Secondary | ICD-10-CM | POA: Diagnosis not present

## 2023-10-21 DIAGNOSIS — Z13220 Encounter for screening for lipoid disorders: Secondary | ICD-10-CM | POA: Diagnosis not present

## 2023-10-21 DIAGNOSIS — Z1211 Encounter for screening for malignant neoplasm of colon: Secondary | ICD-10-CM

## 2023-10-21 DIAGNOSIS — M549 Dorsalgia, unspecified: Secondary | ICD-10-CM | POA: Diagnosis not present

## 2023-10-21 DIAGNOSIS — Z23 Encounter for immunization: Secondary | ICD-10-CM | POA: Diagnosis not present

## 2023-10-21 DIAGNOSIS — F419 Anxiety disorder, unspecified: Secondary | ICD-10-CM

## 2023-10-21 DIAGNOSIS — Z114 Encounter for screening for human immunodeficiency virus [HIV]: Secondary | ICD-10-CM

## 2023-10-21 DIAGNOSIS — Z6829 Body mass index (BMI) 29.0-29.9, adult: Secondary | ICD-10-CM

## 2023-10-21 DIAGNOSIS — Z0001 Encounter for general adult medical examination with abnormal findings: Secondary | ICD-10-CM | POA: Diagnosis not present

## 2023-10-21 LAB — COMPREHENSIVE METABOLIC PANEL
ALT: 28 U/L (ref 0–53)
AST: 22 U/L (ref 0–37)
Albumin: 4.3 g/dL (ref 3.5–5.2)
Alkaline Phosphatase: 85 U/L (ref 39–117)
BUN: 16 mg/dL (ref 6–23)
CO2: 27 meq/L (ref 19–32)
Calcium: 9.3 mg/dL (ref 8.4–10.5)
Chloride: 103 meq/L (ref 96–112)
Creatinine, Ser: 0.84 mg/dL (ref 0.40–1.50)
GFR: 93.51 mL/min (ref >60.00)
Glucose, Bld: 83 mg/dL (ref 70–99)
Potassium: 4.2 meq/L (ref 3.5–5.1)
Sodium: 139 meq/L (ref 135–145)
Total Bilirubin: 0.9 mg/dL (ref 0.2–1.2)
Total Protein: 7.1 g/dL (ref 6.0–8.3)

## 2023-10-21 LAB — CBC WITH DIFFERENTIAL/PLATELET
Basophils Absolute: 0 10*3/uL (ref 0.0–0.1)
Basophils Relative: 0.6 % (ref 0.0–3.0)
Eosinophils Absolute: 0.2 10*3/uL (ref 0.0–0.7)
Eosinophils Relative: 2.2 % (ref 0.0–5.0)
HCT: 48.1 % (ref 39.0–52.0)
Hemoglobin: 16.7 g/dL (ref 13.0–17.0)
Lymphocytes Relative: 22.8 % (ref 12.0–46.0)
Lymphs Abs: 1.6 10*3/uL (ref 0.7–4.0)
MCHC: 34.8 g/dL (ref 30.0–36.0)
MCV: 94.3 fl (ref 78.0–100.0)
Monocytes Absolute: 0.3 10*3/uL (ref 0.1–1.0)
Monocytes Relative: 4.9 % (ref 3.0–12.0)
Neutro Abs: 4.8 10*3/uL (ref 1.4–7.7)
Neutrophils Relative %: 69.5 % (ref 43.0–77.0)
Platelets: 266 10*3/uL (ref 150.0–400.0)
RBC: 5.1 Mil/uL (ref 4.22–5.81)
RDW: 13.9 % (ref 11.5–15.5)
WBC: 6.9 10*3/uL (ref 4.0–10.5)

## 2023-10-21 LAB — VITAMIN B12: Vitamin B-12: 143 pg/mL — ABNORMAL LOW (ref 211–911)

## 2023-10-21 LAB — LIPID PANEL
Cholesterol: 213 mg/dL — ABNORMAL HIGH (ref 0–200)
HDL: 43.6 mg/dL (ref >39.00)
LDL Cholesterol: 140 mg/dL — ABNORMAL HIGH (ref 0–99)
NonHDL: 168.99
Total CHOL/HDL Ratio: 5
Triglycerides: 146 mg/dL (ref 0.0–149.0)
VLDL: 29.2 mg/dL (ref 0.0–40.0)

## 2023-10-21 LAB — TSH: TSH: 1.14 u[IU]/mL (ref 0.35–5.50)

## 2023-10-21 LAB — HEMOGLOBIN A1C: Hgb A1c MFr Bld: 5.6 % (ref 4.6–6.5)

## 2023-10-21 LAB — PSA: PSA: 0.86 ng/mL (ref 0.10–4.00)

## 2023-10-21 LAB — VITAMIN D 25 HYDROXY (VIT D DEFICIENCY, FRACTURES): VITD: 14.74 ng/mL — ABNORMAL LOW (ref 30.00–100.00)

## 2023-10-21 MED ORDER — ZEPBOUND 2.5 MG/0.5ML ~~LOC~~ SOAJ: 2.5000 mg | SUBCUTANEOUS | 0 refills | Status: AC

## 2023-10-21 MED ORDER — CITALOPRAM HYDROBROMIDE 20 MG PO TABS: 20.0000 mg | ORAL_TABLET | Freq: Every day | ORAL | 3 refills | Status: DC

## 2023-10-21 NOTE — Assessment & Plan Note (Signed)
 BMI 29.8 today with comorbidities.  He would be a good candidate for GLP-1 agonist based on his weight and comorbidities.  We did discuss potential side effects.  Will start Zepbound 2.5 mg weekly.  He will follow-up with [place] in a few weeks and we can adjust the dose as tolerated.  We also discussed lifestyle interventions today.

## 2023-10-21 NOTE — Telephone Encounter (Signed)
 PA needed for Zepbound 2.5 mg

## 2023-10-21 NOTE — Assessment & Plan Note (Signed)
 Patient declined filling out PHQ and GAD today as he is currently following with the [place] and they are providing him therapy services.  No SI or HI.  Overall symptoms are manageable though he does have frequent outbursts and he would like to have medication to help him take off the edge.  [facility]  We did discuss potential medication treatment options today and he is interested in starting an SSRI.  Will start Celexa 20 mg daily.  Discussed potential side effects.  He will follow-up with me in a few weeks via MyChart.  [facility] as [facility].

## 2023-10-21 NOTE — Patient Instructions (Addendum)
 It was very nice to see you today!  We will check blood work today.   We will order Cologuard.   I will refer you to see the sports medicine.   We gave you your tetatnus vaccine today.   You can get the shingles vaccine at the pharmacy.  We will start Zepbound to help you with appetite control and weight loss.  Will start Celexa 20 mg daily today.  Send a message in a few weeks to let me know how you are doing with this.  Return in about 1 year (around 10/20/2024) for Annual Physical.   Take care, Dr Jimmey Ralph  PLEASE NOTE:  If you had any lab tests, please let us know if you have not heard back within a few days. You may see your results on mychart before we have a chance to review them but we will give you a call once they are reviewed by [place].   If we ordered any referrals today, please let us know if you have not heard from their office within the next week.   If you had any urgent prescriptions sent in today, please check with the pharmacy within an hour of our visit to make sure the prescription was transmitted appropriately.   Please try these tips to maintain a healthy lifestyle:  Eat at least 3 REAL meals and 1-2 snacks per day.  Aim for no more than 5 hours between eating.  If you eat breakfast, please do so within one hour of getting up.   Each meal should contain half fruits/vegetables, one quarter protein, and one quarter carbs (no bigger than a computer mouse)  Cut down on sweet beverages. This includes juice, soda, and sweet tea.   Drink at least 1 glass of water with each meal and aim for at least 8 glasses per day  Exercise at least 150 minutes every week.    Preventive Care 4-35 Years Old, Male Preventive care refers to lifestyle choices and visits with your health care provider that can promote health and wellness. Preventive care visits are also called wellness exams. What can I expect for my preventive care visit? Counseling During your preventive care  visit, your health care provider may ask about your: Medical history, including: Past medical problems. Family medical history. Current health, including: Emotional well-being. Home life and relationship well-being. Sexual activity. Lifestyle, including: Alcohol, nicotine or tobacco, and drug use. Access to firearms. Diet, exercise, and sleep habits. Safety issues such as seatbelt and bike helmet use. Sunscreen use. Work and work Astronomer. Physical exam Your health care provider will check your: Height and weight. These may be used to calculate your BMI (body mass index). BMI is a measurement that tells if you are at a healthy weight. Waist circumference. This measures the distance around your waistline. This measurement also tells if you are at a healthy weight and may help predict your risk of certain diseases, such as type 2 diabetes and high blood pressure. Heart rate and blood pressure. Body temperature. Skin for abnormal spots. What immunizations do I need?  Vaccines are usually given at various ages, according to a schedule. Your health care provider will recommend vaccines for you based on your age, medical history, and lifestyle or other factors, such as travel or where you work. What tests do I need? Screening Your health care provider may recommend screening tests for certain conditions. This may include: Lipid and cholesterol levels. Diabetes screening. This is done by checking your blood  sugar (glucose) after you have not eaten for a while (fasting). Hepatitis B test. Hepatitis C test. HIV (human immunodeficiency virus) test. STI (sexually transmitted infection) testing, if you are at risk. Lung cancer screening. Prostate cancer screening. Colorectal cancer screening. Talk with your health care provider about your test results, treatment options, and if necessary, the need for more tests. Follow these instructions at home: Eating and drinking  Eat a diet that  includes fresh fruits and vegetables, whole grains, lean protein, and low-fat dairy products. Take vitamin and mineral supplements as recommended by your health care provider. Do not drink alcohol if your health care provider tells you not to drink. If you drink alcohol: Limit how much you have to 0-2 drinks a day. Know how much alcohol is in your drink. In the [place] one drink equals one 12 oz bottle of beer (355 mL), one 5 oz glass of wine (148 mL), or one 1 oz glass of hard liquor (44 mL). Lifestyle Brush your teeth every morning and night with fluoride toothpaste. Floss one time each day. Exercise for at least 30 minutes 5 or more days each week. Do not use any products that contain nicotine or tobacco. These products include cigarettes, chewing tobacco, and vaping devices, such as e-cigarettes. If you need help quitting, ask your health care provider. Do not use drugs. If you are sexually active, practice safe sex. Use a condom or other form of protection to prevent STIs. Take aspirin only as told by your health care provider. Make sure that you understand how much to take and what form to take. Work with your health care provider to find out whether it is safe and beneficial for you to take aspirin daily. Find healthy ways to manage stress, such as: Meditation, yoga, or listening to music. Journaling. Talking to a trusted person. Spending time with friends and family. Minimize exposure to UV radiation to reduce your risk of skin cancer. Safety Always wear your seat belt while driving or riding in a vehicle. Do not drive: If you have been drinking alcohol. Do not ride with someone who has been drinking. When you are tired or distracted. While texting. If you have been using any mind-altering substances or drugs. Wear a helmet and other protective equipment during sports activities. If you have firearms in your house, make sure you follow all gun safety procedures. What's next? Go to  your health care provider once a year for an annual wellness visit. Ask your health care provider how often you should have your eyes and teeth checked. Stay up to date on all vaccines. This information is not intended to replace advice given to you by your health care provider. Make sure you discuss any questions you have with your health care provider. Document Revised: 01/28/2021 Document Reviewed: 01/28/2021 Elsevier Patient Education  2024 [employer].

## 2023-10-21 NOTE — Progress Notes (Signed)
 Chief Complaint:  Joseph [name] is a 63 y.o. male who presents today for his annual comprehensive physical exam.    Assessment/Plan:  Chronic Problems Addressed Today: Anxiety Patient declined filling out PHQ and GAD today as [facility].  No SI or HI.  Overall symptoms are manageable though he does have frequent outbursts and he would like to have medication to help him take off the edge.  [facility] is not [facility] any medications.  We did discuss potential medication treatment options today and he is interested in starting an SSRI.  Will start Celexa 20 mg daily.  Discussed potential side effects.  He will follow-up with me in a few weeks via MyChart.  [facility] will also [facility] with [facility] as well.  Overweight BMI 29.8 today with comorbidities.  He would be a good candidate for GLP-1 agonist based on his weight and comorbidities.  We did discuss potential side effects.  Will start Zepbound 2.5 mg weekly.  He will follow-up with [place] in a few weeks and we can adjust the dose as tolerated.  We also discussed lifestyle interventions today.  Chronic back pain Pain is stable but still uncontrolled.  Still has frequent back pain that limits his ability to exercise and be as active as he would like.  This has been going on for several years.  Will place referral to sports medicine per patient request.  Preventative Healthcare: Tdap given today.  Check labs.  Order Cologuard.  We discussed shingles vaccine-he will get this at the pharmacy.  Patient Counseling(The following topics were reviewed and/or handout was given):  -Nutrition: Stressed importance of moderation in sodium/caffeine intake, saturated fat and cholesterol, caloric balance, sufficient intake of fresh fruits, vegetables, and fiber.  -Stressed the importance of regular exercise.   -Substance Abuse: Discussed cessation/primary prevention of tobacco, alcohol, or  other drug use; driving or other dangerous activities under the influence; availability of treatment for abuse.   -Injury prevention: Discussed safety belts, safety helmets, smoke detector, smoking near bedding or upholstery.   -Sexuality: Discussed sexually transmitted diseases, partner selection, use of condoms, avoidance of unintended pregnancy and contraceptive alternatives.   -Dental health: Discussed importance of regular tooth brushing, flossing, and dental visits.  -Health maintenance and immunizations reviewed. Please refer to Health maintenance section.  Return to care in 1 year for next preventative visit.     Subjective:  HPI:  He has no acute complaints today. See Assessment / plan for status of chronic conditions.  Last seen here a few years ago.  [facility] last visit as well.  He has had a lot more stress and anxiety recently.  Also has been diagnosed with PTSD.  He is following with therapist at [facility] for this.  They have not prescribed him any medications however he is interested in potentially starting medication to help with managing his outbursts.  Does not wish for anything stronger than just once in bed to help him take the edge off.  He has never tried anything for this in the past.  No SI or HI.  Lifestyle Diet: None specific.  Exercise: Limited due to frequent travel.      12/10/2020    8:49 AM  Depression screen PHQ 2/9  Decreased Interest 0  Down, Depressed, Hopeless 0  PHQ - 2 Score 0    Health Maintenance  Due  Topic Date Due   HIV Screening  Never done   Fecal DNA (Cologuard)  03/07/2022     ROS: Per HPI, otherwise a complete review of systems was negative.   PMH:  The following were reviewed and entered/updated in epic: Past Medical History:  Diagnosis Date   Allergy    Pneumonia    Patient Active Problem List   Diagnosis Date Noted   Anxiety 10/21/2023   Chronic back pain 12/09/2020   Insomnia  12/09/2020   Overweight 12/09/2020   Pruritus 10/17/2018   Tobacco use disorder 02/27/2013   Past Surgical History:  Procedure Laterality Date   BREAST BIOPSY     breast lump - benign   LIPOMA EXCISION N/A 04/23/2021   Procedure: EXCISION BACK MASS;  Surgeon: Abigail Miyamoto, MD;  Location: [facility] OR;  Service: General;  Laterality: N/A;    Family History  Problem Relation Age of Onset   Cancer Mother 75       colon cancer   Alcohol abuse Father    Drug abuse Father    Cancer Sister        Breast Cancer   Stroke Neg Hx    Early death Neg Hx    Hearing loss Neg Hx    Heart disease Neg Hx    Hyperlipidemia Neg Hx    Hypertension Neg Hx    Kidney disease Neg Hx     Medications- reviewed and updated Current Outpatient Medications  Medication Sig Dispense Refill   citalopram (CELEXA) 20 MG tablet Take 1 tablet (20 mg total) by mouth daily. 30 tablet 3   tirzepatide (ZEPBOUND) 2.5 MG/0.5ML Pen Inject 2.5 mg into the skin once a week. 2 mL 0   No current facility-administered medications for this visit.    Allergies-reviewed and updated Allergies  Allergen Reactions   Sulfa Antibiotics     Unknown reaction     Social History   Socioeconomic History   Marital status: Divorced    Spouse name: Not on file   Number of children: Not on file   Years of education: Not on file   Highest education level: Not on file  Occupational History   Not on file  Tobacco Use   Smoking status: Former   Smokeless tobacco: Never  Vaping Use   Vaping status: Every Day  Substance and Sexual Activity   Alcohol use: Yes    Comment: occasional   Drug use: No   Sexual activity: Not Currently  Other Topics Concern   Not on file  Social History Narrative   Not on file   Social Drivers of Health   Financial Resource Strain: Not on file  Food Insecurity: No Food Insecurity (12/10/2020)   Hunger Vital Sign    Worried About Running Out of Food in the Last Year: Never true    Ran Out of  Food in the Last Year: Never true  Transportation Needs: Not on file  Physical Activity: Not on file  Stress: Not on file  Social Connections: Not on file        Objective:  Physical Exam: BP 130/80 (BP Location: Left Arm, Patient Position: Sitting, Cuff Size: Large)   Pulse 77   Temp 98.2 F (36.8 C) (Temporal)   Ht 5\' 8"  (1.727 m)   Wt 196 lb (88.9 kg)   SpO2 94%   BMI 29.80 kg/m   Body mass index is 29.8 kg/m. Wt Readings from Last 3 Encounters:  10/21/23 196 lb (  88.9 kg)  04/23/21 194 lb 4.8 oz (88.1 kg)  04/17/21 194 lb 4.8 oz (88.1 kg)   Gen: NAD, resting comfortably HEENT: TMs normal bilaterally. OP clear. No thyromegaly noted.  CV: RRR with no murmurs appreciated Pulm: NWOB, CTAB with no crackles, wheezes, or rhonchi GI: Normal bowel sounds present. Soft, Nontender, Nondistended. MSK: no edema, cyanosis, or clubbing noted Skin: warm, dry Neuro: CN2-12 grossly intact. Strength 5/5 in upper and lower extremities. Reflexes symmetric and intact bilaterally.  Psych: Normal affect and thought content     Shalaya Swailes M. Jimmey Ralph, MD 10/21/2023 10:04 AM

## 2023-10-21 NOTE — Assessment & Plan Note (Signed)
 Pain is stable but still uncontrolled.  Still has frequent back pain that limits his ability to exercise and be as active as he would like.  This has been going on for several years.  Will place referral to sports medicine per patient request.

## 2023-10-22 LAB — HIV ANTIBODY (ROUTINE TESTING W REFLEX): HIV 1&2 Ab, 4th Generation: NONREACTIVE

## 2023-10-24 ENCOUNTER — Telehealth: Payer: Self-pay

## 2023-10-24 ENCOUNTER — Encounter: Payer: Self-pay | Admitting: Family Medicine

## 2023-10-24 ENCOUNTER — Other Ambulatory Visit (HOSPITAL_COMMUNITY): Payer: Self-pay

## 2023-10-24 DIAGNOSIS — E785 Hyperlipidemia, unspecified: Secondary | ICD-10-CM | POA: Insufficient documentation

## 2023-10-24 NOTE — Telephone Encounter (Signed)
 Pharmacy Patient Advocate Encounter  Received notification from [facility] that Prior Authorization for Zepbound 2.5MG /0.5ML pen-injectors  has been DENIED.  Full denial letter will be uploaded to the media tab. See denial reason below.   PA #/Case ID/Reference #: [id number]

## 2023-10-24 NOTE — Telephone Encounter (Signed)
 Noted.

## 2023-10-24 NOTE — Progress Notes (Signed)
 Cholesterol is elevated.  Recommend starting statin to lower his numbers and reduce risk of heart attack and stroke.  Please send in Lipitor 40 mg daily if he is agreeable to start.  We should recheck in 3 to 6 months.  Vitamin D is low.  Recommend starting vitamin D supplementation 50,000 IUs once weekly.  Please send in new prescription.  I would like to recheck this in 3 to 6 months.  B12 is also low.  Recommend starting 1000 mcg daily.  He can get this over-the-counter.  We should also recheck this in 3 to 6 months.  The rest of his labs are all at goal and we can recheck everything else in a year or so.

## 2023-10-24 NOTE — Telephone Encounter (Signed)
 Pharmacy Patient Advocate Encounter   Received notification from Pt Calls Messages that prior authorization for Zepbound 2.5MG /0.5ML pen-injectors is required/requested.   Insurance verification completed.   The patient is insured through [facility] .   Per test claim: PA required; PA submitted to above mentioned insurance via CoverMyMeds Key/confirmation #/EOC [id number] Status is pending

## 2023-10-24 NOTE — Telephone Encounter (Signed)
 PA request has been Submitted. New Encounter has been or will be created for follow up. For additional info see Pharmacy Prior Auth telephone encounter from 10/24/23.

## 2023-10-25 NOTE — Progress Notes (Unsigned)
    Aleen Sells D.[facility] [address] [place] [address] Phone: [phone]   Assessment and Plan:     There are no diagnoses linked to this encounter.  ***   Pertinent previous records reviewed include ***    Follow Up: ***     Subjective:   I, Davyn Elsasser, am serving as a Neurosurgeon for Doctor Richardean Sale  Chief Complaint: low back pain   HPI:   10/26/2023 Patient is a 63 year old male with low back pain. Patient states   Relevant Historical Information: ***  Additional pertinent review of systems negative.   Current Outpatient Medications:    citalopram (CELEXA) 20 MG tablet, Take 1 tablet (20 mg total) by mouth daily., Disp: 30 tablet, Rfl: 3   tirzepatide (ZEPBOUND) 2.5 MG/0.5ML Pen, Inject 2.5 mg into the skin once a week., Disp: 2 mL, Rfl: 0   Objective:     There were no vitals filed for this visit.    There is no height or weight on file to calculate BMI.    Physical Exam:    ***   Electronically signed by:  Aleen Sells D.[facility] 7:48 AM 10/25/23

## 2023-10-26 ENCOUNTER — Ambulatory Visit (INDEPENDENT_AMBULATORY_CARE_PROVIDER_SITE_OTHER)

## 2023-10-26 ENCOUNTER — Other Ambulatory Visit: Payer: Self-pay

## 2023-10-26 ENCOUNTER — Ambulatory Visit: Admitting: Sports Medicine

## 2023-10-26 VITALS — BP 130/80 | HR 75 | Ht 68.0 in | Wt 197.0 lb

## 2023-10-26 DIAGNOSIS — M5441 Lumbago with sciatica, right side: Secondary | ICD-10-CM | POA: Diagnosis not present

## 2023-10-26 DIAGNOSIS — M545 Low back pain, unspecified: Secondary | ICD-10-CM | POA: Diagnosis not present

## 2023-10-26 DIAGNOSIS — E538 Deficiency of other specified B group vitamins: Secondary | ICD-10-CM

## 2023-10-26 DIAGNOSIS — G8929 Other chronic pain: Secondary | ICD-10-CM | POA: Diagnosis not present

## 2023-10-26 DIAGNOSIS — E785 Hyperlipidemia, unspecified: Secondary | ICD-10-CM

## 2023-10-26 DIAGNOSIS — E7800 Pure hypercholesterolemia, unspecified: Secondary | ICD-10-CM

## 2023-10-26 DIAGNOSIS — E559 Vitamin D deficiency, unspecified: Secondary | ICD-10-CM

## 2023-10-26 DIAGNOSIS — M51362 Other intervertebral disc degeneration, lumbar region with discogenic back pain and lower extremity pain: Secondary | ICD-10-CM

## 2023-10-26 DIAGNOSIS — M5442 Lumbago with sciatica, left side: Secondary | ICD-10-CM

## 2023-10-26 MED ORDER — ATORVASTATIN CALCIUM 40 MG PO TABS: 40.0000 mg | ORAL_TABLET | Freq: Every day | ORAL | 3 refills | Status: DC

## 2023-10-26 MED ORDER — MELOXICAM 15 MG PO TABS: 15.0000 mg | ORAL_TABLET | Freq: Every day | ORAL | 0 refills | Status: AC

## 2023-10-26 MED ORDER — VITAMIN D (ERGOCALCIFEROL) 1.25 MG (50000 UNIT) PO CAPS: 50000.0000 [IU] | ORAL_CAPSULE | ORAL | 3 refills | Status: DC

## 2023-10-26 NOTE — Patient Instructions (Addendum)
-   Start meloxicam 15 mg daily x2 weeks.  If still having pain after 2 weeks, complete 3rd-week of NSAID. May use remaining NSAID as needed once daily for pain control.  Do not to use additional over-the-counter NSAIDs (ibuprofen, naproxen, Advil, Aleve) while taking prescription NSAIDs.  May use Tylenol [phone] mg 2 to 3 times a day for breakthrough pain. Low back HEP  PT referral  6 week follow up

## 2023-10-27 ENCOUNTER — Encounter: Payer: Self-pay | Admitting: Family Medicine

## 2023-10-27 NOTE — Telephone Encounter (Signed)
 I appreciate the update.  I am sorry to hear that he is having fatigue.  We can switch to Lexapro 5 mg daily.  I would like for him to follow-up with [place] in a few weeks.  He does not need to cross titrate the medications.  He can stop his Celexa and start Lexapro with his next dose.  Please send a new prescription.

## 2023-10-28 ENCOUNTER — Other Ambulatory Visit: Payer: Self-pay

## 2023-10-28 MED ORDER — ESCITALOPRAM OXALATE 5 MG PO TABS: 5.0000 mg | ORAL_TABLET | Freq: Every day | ORAL | 0 refills | Status: DC

## 2023-10-31 LAB — COLOGUARD: COLOGUARD: NEGATIVE

## 2023-11-01 ENCOUNTER — Encounter: Payer: Self-pay | Admitting: Family Medicine

## 2023-11-01 NOTE — Progress Notes (Signed)
Great news! Cologuard is negative. We can repeat in 3 years.

## 2023-11-10 ENCOUNTER — Encounter: Payer: Self-pay | Admitting: Sports Medicine

## 2023-11-12 ENCOUNTER — Other Ambulatory Visit: Payer: Self-pay | Admitting: Family Medicine

## 2023-11-21 ENCOUNTER — Telehealth: Payer: Self-pay | Admitting: Family Medicine

## 2023-11-21 ENCOUNTER — Other Ambulatory Visit: Payer: Self-pay | Admitting: *Deleted

## 2023-11-21 ENCOUNTER — Telehealth: Payer: Self-pay | Admitting: *Deleted

## 2023-11-21 MED ORDER — ESCITALOPRAM OXALATE 5 MG PO TABS: 5.0000 mg | ORAL_TABLET | Freq: Every day | ORAL | 1 refills | Status: AC

## 2023-11-21 NOTE — Telephone Encounter (Signed)
 noted

## 2023-11-21 NOTE — Telephone Encounter (Signed)
 Duplicate request   escitalopram (LEXAPRO) 5 MG tablet 90 tablet 1 11/21/2023 --  Sig - Route: Take 1 tablet (5 mg total) by mouth daily. - Oral  Sent to pharmacy as: escitalopram (LEXAPRO) 5 MG tablet  E-Prescribing Status: Receipt confirmed by pharmacy (11/21/2023 11:10 AM EDT)

## 2023-11-21 NOTE — Telephone Encounter (Signed)
 Copied from CRM [id number]. Topic: Clinical - Medication Refill >> Nov 21, 2023 10:34 AM Ivette P wrote: Most Recent Primary Care Visit:  Provider: Ardith Dark  Department: [facility]  Visit Type: OFFICE VISIT  Date: 10/21/2023  Medication: escitalopram (LEXAPRO) 5 MG tablet  Has the patient contacted their pharmacy? Yes (Agent: If no, request that the patient contact the pharmacy for the refill. If patient does not wish to contact the pharmacy document the reason why and proceed with request.) (Agent: If yes, when and what did the pharmacy advise?)  Is this the correct pharmacy for this prescription? Yes If no, delete pharmacy and type the correct one.  This is the patient's preferred pharmacy:  [id number] [place], [place] - [address] [place] [address] Phone: [phone] Fax: [phone]   Has the prescription been filled recently? Yes, 10/28/2023- pt was given 1 month trial. Pt states he would like a 3 month refill.   Is the patient out of the medication? Yes, has about 7-10 pills left   Has the patient been seen for an appointment in the last year OR does the patient have an upcoming appointment? Yes  Can we respond through MyChart? Yes  Agent: Please be advised that Rx refills may take up to 3 business days. We ask that you follow-up with your pharmacy.   Rx send to pharmacy with #90 supply  [facility]

## 2023-11-21 NOTE — Telephone Encounter (Signed)
 Copied from CRM [id number]. Topic: Clinical - Medication Refill >> Nov 21, 2023 10:34 AM Ivette P wrote: Most Recent Primary Care Visit:  Provider: Ardith Dark  Department: [facility]  Visit Type: OFFICE VISIT  Date: 10/21/2023  Medication: escitalopram (LEXAPRO) 5 MG tablet  Has the patient contacted their pharmacy? Yes (Agent: If no, request that the patient contact the pharmacy for the refill. If patient does not wish to contact the pharmacy document the reason why and proceed with request.) (Agent: If yes, when and what did the pharmacy advise?)  Is this the correct pharmacy for this prescription? Yes If no, delete pharmacy and type the correct one.  This is the patient's preferred pharmacy:  [id number] [place], [place] - [address] [place] [address] Phone: [phone] Fax: [phone]   Has the prescription been filled recently? Yes, 10/28/2023- pt was given 1 month trial. Pt states he would like a 3 month refill.   Is the patient out of the medication? Yes, has about 7-10 pills left   Has the patient been seen for an appointment in the last year OR does the patient have an upcoming appointment? Yes  Can we respond through MyChart? Yes  Agent: Please be advised that Rx refills may take up to 3 business days. We ask that you follow-up with your pharmacy.

## 2023-11-22 ENCOUNTER — Other Ambulatory Visit: Payer: Self-pay | Admitting: Sports Medicine

## 2023-11-22 ENCOUNTER — Other Ambulatory Visit: Payer: Self-pay

## 2023-11-22 ENCOUNTER — Encounter: Payer: Self-pay | Admitting: Physical Therapy

## 2023-11-22 ENCOUNTER — Ambulatory Visit: Attending: Sports Medicine | Admitting: Physical Therapy

## 2023-11-22 DIAGNOSIS — M5459 Other low back pain: Secondary | ICD-10-CM | POA: Insufficient documentation

## 2023-11-22 DIAGNOSIS — G8929 Other chronic pain: Secondary | ICD-10-CM | POA: Diagnosis not present

## 2023-11-22 DIAGNOSIS — M5442 Lumbago with sciatica, left side: Secondary | ICD-10-CM | POA: Insufficient documentation

## 2023-11-22 DIAGNOSIS — M62838 Other muscle spasm: Secondary | ICD-10-CM | POA: Diagnosis present

## 2023-11-22 DIAGNOSIS — R293 Abnormal posture: Secondary | ICD-10-CM | POA: Diagnosis present

## 2023-11-22 DIAGNOSIS — M5441 Lumbago with sciatica, right side: Secondary | ICD-10-CM | POA: Diagnosis not present

## 2023-11-22 DIAGNOSIS — M6281 Muscle weakness (generalized): Secondary | ICD-10-CM | POA: Insufficient documentation

## 2023-11-22 NOTE — Therapy (Signed)
 OUTPATIENT PHYSICAL THERAPY THORACOLUMBAR EVALUATION   Patient Name: Joseph [name] MRN: [id number] DOB:01-30-1961, 63 y.o., male Today's Date: 11/22/2023  END OF SESSION:  PT End of Session - 11/22/23 0948     Visit Number 1    Number of Visits 19    Date for PT Re-Evaluation 02/25/24    Authorization Type [employer] PPO    PT Start Time 0808    PT Stop Time 0846    PT Time Calculation (min) 38 min    Activity Tolerance Patient tolerated treatment well    Behavior During Therapy WFL for tasks assessed/performed             Past Medical History:  Diagnosis Date   Allergy    Pneumonia    Past Surgical History:  Procedure Laterality Date   BREAST BIOPSY     breast lump - benign   LIPOMA EXCISION N/A 04/23/2021   Procedure: EXCISION BACK MASS;  Surgeon: Joseph Miyamoto, MD;  Location: [facility] OR;  Service: General;  Laterality: N/A;   Patient Active Problem List   Diagnosis Date Noted   Dyslipidemia 10/24/2023   Anxiety 10/21/2023   Chronic back pain 12/09/2020   Insomnia 12/09/2020   Overweight 12/09/2020   Pruritus 10/17/2018   Tobacco use disorder 02/27/2013    PCP: Joseph Dark, MD   REFERRING PROVIDER: Richardean Sale, DO  REFERRING DIAG: M54.50 (ICD-10-CM) - Low back pain, unspecified back pain laterality, unspecified chronicity, unspecified whether sciatica present  Rationale for Evaluation and Treatment: Rehabilitation  THERAPY DIAG:  Other low back pain - Plan: PT plan of care cert/re-cert  Muscle weakness (generalized) - Plan: PT plan of care cert/re-cert  Other muscle spasm - Plan: PT plan of care cert/re-cert  Abnormal posture - Plan: PT plan of care cert/re-cert  ONSET DATE: Chronic  SUBJECTIVE:                                                                                                                                                                                           SUBJECTIVE STATEMENT: Patient presents with chronic back pain.  He has been to multiple chiropractors, and it provided short term benefits. He reports having good days and bad days. Some days he cannot get out of bed. He got a mass removed 2 years ago in between his shoulder blades and that causes him some discomfort. In a two week span he reports having around 2 bad days. He travels a lot for work and depending on the type of bed he sleeps on it aggravates his back. He sometimes gets numbness in his legs after standing 10-15 minutes.  Numbness will subside 10-15 mins after he sits down. Sitting tolerance for driving is poor. He is able to drive for 1-2 hours before needing to stand up and stretch. His walking tolerance is not as bad as his standing and sitting tolerance.   PERTINENT HISTORY:  Anxiety;   PAIN:  Are you having pain? Yes: NPRS scale: 3-4(currently) 9-10(worst)/10 Pain location: bilateral lumbar area & in between shoulder blades Pain description: mainly dull sometimes sharp Aggravating factors: lifting; sleeping position  Relieving factors: Ibuprofen   PRECAUTIONS: None  RED FLAGS: None   WEIGHT BEARING RESTRICTIONS: No  FALLS:  Has patient fallen in last 6 months? 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  DIAGNOSTIC FINDINGS:  Lumbar Spine Xray  IMPRESSION: 1. Normal appearance of the lumbar spine. 2. Small lower pole left intrarenal stones.  PATIENT SURVEYS:  Modified Oswestry 16/50 32%   COGNITION: Overall cognitive status: Within functional limits for tasks  assessed     SENSATION: Some leg numbness after standing > 10-15 mins  MUSCLE LENGTH: Hamstrings: decreased bilateral Lt > Rt    POSTURE: rounded shoulders and forward head  PALPATION: Increased muscle spasms bilateral lumbar paraspinals Decreased joint mobility throughout Lt spine. Patient verbalized some soreness  LUMBAR ROM: no pain with these motions but some tightness   AROM eval  Flexion Bends to skins  Extension 30% limited  Right lateral flexion Bends to knee joint line  Left lateral flexion Bends to knee joint line  Right rotation full  Left rotation full   (Blank rows = not tested)  LOWER EXTREMITY ROM:   WFL bilateral    LOWER EXTREMITY MMT:  Grossly 4-4+/5 bilateral     FUNCTIONAL TESTS:  5 times sit to stand: 11.82 sec no UE support Sit to stand somewhat guarded when performing   GAIT: Comments: Mildly guarded; decreased cadence  TREATMENT DATE:  11/22/2023 Initial Evaluation & HEP Created                                                                                                                                 PATIENT EDUCATION:  Education details: POC; HEP Person educated: Patient Education method: Programmer, multimedia, Facilities manager, and Handouts Education comprehension: verbalized understanding, returned demonstration, and needs further education  HOME EXERCISE PROGRAM: Access Code: [email or web address] URL: [email or web address] Date: 11/22/2023 Prepared by: Joseph [name]  Exercises - Supine Lower Trunk Rotation  - 1 x daily - 7 x weekly - 10 reps - Sidelying Thoracic Rotation with Open Book  - 1 x daily - 7 x  weekly - 5 reps - Seated Hamstring Stretch  - 1 x daily - 7 x weekly - 2 sets - 20-30 hold - Seated Figure 4 Piriformis Stretch  - 1 x daily - 7 x weekly - 2 sets - 20-30 hold - Standing 'L' Stretch at Counter  - 1 x daily - 7 x weekly - 2 sets - 5 reps - 5 hold  ASSESSMENT:  CLINICAL IMPRESSION: Patient is a 63 y.o. male who was  seen today for physical therapy evaluation and treatment for chronic low back pain. Joseph [name] presents to therapy with chronic low back pain that he has had for years. He is a retired IT sales professional and performing a lot of lifting for his job. He reports having good and bad days in terms of his back pain, and when his pain is at its worst he is unable to get out of bed. Noted some guarding when performing sit to stand transfer. Based on evaluation noted increased muscle spasms, decreased joint mobility, and muscle weakness. Joseph [name] travels a lot for work and that poses as a potential barrier for progress made. However, patient is motivated and wants to manage his pain levels. Patient will benefit from skilled PT to address the below impairments and improve overall function.   OBJECTIVE IMPAIRMENTS: decreased endurance, decreased mobility, decreased ROM, decreased strength, hypomobility, increased muscle spasms, impaired flexibility, impaired sensation, postural dysfunction, and pain.   ACTIVITY LIMITATIONS: lifting, bending, sitting, standing, squatting, sleeping, and stairs  PARTICIPATION LIMITATIONS: cleaning, laundry, driving, community activity, and occupation  PERSONAL FACTORS: Fitness, Profession, Time since onset of injury/illness/exacerbation, and 1 comorbidity: anxiety  are also affecting patient's functional outcome.   REHAB POTENTIAL: Good  CLINICAL DECISION MAKING: Stable/uncomplicated  EVALUATION COMPLEXITY: Low   GOALS: Goals reviewed with patient? Yes  SHORT TERM GOALS: Target date: 01/03/2024  Patient will be independent with initial HEP. Baseline:  Goal status: INITIAL  2.  Patient will report > or = to 30% improvement in back pain since starting PT. Baseline:  Goal status: INITIAL     LONG TERM GOALS: Target date: 02/25/2024 Patient will demonstrate independence in advanced HEP. Baseline:  Goal status: INITIAL  2.  Patient will report > or = to 70% improvement in back pain  since starting PT. Baseline:  Goal status: INITIAL  3.  Patient will verbalize and demonstrate self-care strategies to manage pain including tissue mobility practices and change of position. Baseline:  Goal status: INITIAL   4.  Patient will be able to stand for > or = to 30 minutes before onset of leg numbness. Baseline: 10-15 minutes Goal status: INITIAL  5.  Patient will be able to travel for work with < or = to 2/10 back pain. Baseline:  Goal status: INITIAL   PLAN:  PT FREQUENCY: 2x/week  PT DURATION: other: 15 weeks. (Patient will be traveling and will not return until 02/14/2024)  PLANNED INTERVENTIONS: 97164- PT Re-evaluation, 97110-Therapeutic exercises, 97530- Therapeutic activity, 97112- Neuromuscular re-education, 97535- Self Care, 91478- Manual therapy, [phone]- Gait training, [phone]- Canalith repositioning, [id number]- Aquatic Therapy, [phone]- Electrical stimulation (unattended), [phone]- Electrical stimulation (manual), [id number]- Vasopneumatic device, [id number]- Ultrasound, [id number]- Traction (mechanical), [id number]- Ionotophoresis 4mg /ml Dexamethasone, Patient/Family education, Balance training, Stair training, Taping, Dry Needling, Joint mobilization, Joint manipulation, Spinal manipulation, Spinal mobilization, Vestibular training, Cryotherapy, and Moist heat.  PLAN FOR NEXT SESSION: Assess HEP; Nustep; TA activation; hip and lumbar flexibility; 3/6MWT   Joseph [name], PT 11/22/23 9:50 AM [facility] [address]  [place], [place] [address] Phone # [phone] Fax [phone]

## 2023-11-22 NOTE — Progress Notes (Unsigned)
    Joseph [name] D.[facility] [address] [place] [address] Phone: [phone]   Assessment and Plan:     There are no diagnoses linked to this encounter.  ***   Pertinent previous records reviewed include ***    Follow Up: ***     Subjective:   I, Joseph [name], am serving as a Neurosurgeon for Doctor Richardean Sale  Chief Complaint: low back pain    HPI:    10/26/2023 Patient is a 63 year old male with low back pain. Patient states can't stand for a long period of time. Sometimes the pain is in between his shoulder blades. Hard to get out of bed sometimes. Pain is chronic for over 30 years now. Patient used to take vicodin daily to cope with the pain but he stopped cold [place] because he knew that all that medication was not good for him. He has tried chiropractors and other doctors and the pain has not gotten any better.    Duration? years Did you have an Injury to cause this pain? 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 none Numbness or Tingling? Yes more of a dull pain right leg Does the pain Radiate? Yes once in a while Altered gait or use? no ROM/ impairment of movement? Bending and reaching  11/23/2023 Patient states    Relevant Historical Information: None pertinent  Additional pertinent review of systems negative.   Current Outpatient Medications:    atorvastatin (LIPITOR) 40 MG tablet, Take 1 tablet (40 mg total) by mouth daily., Disp: 90 tablet, Rfl: 3   citalopram (CELEXA) 20 MG tablet, TAKE 1 TABLET BY MOUTH EVERY DAY, Disp: 90 tablet, Rfl: 2   escitalopram (LEXAPRO) 5 MG tablet, Take 1 tablet (5 mg total) by mouth daily., Disp: 90 tablet, Rfl: 1   meloxicam (MOBIC) 15 MG tablet, Take 1 tablet (15 mg total) by mouth daily., Disp: 30 tablet, Rfl: 0   tirzepatide (ZEPBOUND) 2.5 MG/0.5ML Pen, Inject 2.5 mg into the skin once a week., Disp: 2 mL, Rfl: 0   Vitamin D, Ergocalciferol,  (DRISDOL) 1.25 MG (50000 UNIT) CAPS capsule, Take 1 capsule (50,000 Units total) by mouth every 7 (seven) days., Disp: 5 capsule, Rfl: 3   Objective:     There were no vitals filed for this visit.    There is no height or weight on file to calculate BMI.    Physical Exam:    ***   Electronically signed by:  Joseph [name] [facility] 7:33 AM 11/22/23

## 2023-11-23 ENCOUNTER — Ambulatory Visit: Admitting: Sports Medicine

## 2023-11-23 VITALS — HR 66 | Ht 68.0 in | Wt 197.0 lb

## 2023-11-23 DIAGNOSIS — G8929 Other chronic pain: Secondary | ICD-10-CM | POA: Diagnosis not present

## 2023-11-23 DIAGNOSIS — M51362 Other intervertebral disc degeneration, lumbar region with discogenic back pain and lower extremity pain: Secondary | ICD-10-CM | POA: Diagnosis not present

## 2023-11-23 MED ORDER — MELOXICAM 15 MG PO TABS: 15.0000 mg | ORAL_TABLET | Freq: Every day | ORAL | 1 refills | Status: AC | PRN

## 2023-11-23 NOTE — Patient Instructions (Addendum)
 Tylenol [phone] mg 2-3 times a day for pain relief  Meloxicam as needed  Continue HEP and PT  Stop every few hours on your trip to stretch  Follow up in July when you return to [facility]

## 2023-11-25 ENCOUNTER — Ambulatory Visit: Admitting: Physical Therapy

## 2023-11-25 ENCOUNTER — Encounter: Payer: Self-pay | Admitting: Physical Therapy

## 2023-11-25 DIAGNOSIS — M5459 Other low back pain: Secondary | ICD-10-CM | POA: Diagnosis not present

## 2023-11-25 DIAGNOSIS — M6281 Muscle weakness (generalized): Secondary | ICD-10-CM

## 2023-11-25 DIAGNOSIS — M62838 Other muscle spasm: Secondary | ICD-10-CM

## 2023-11-25 DIAGNOSIS — R293 Abnormal posture: Secondary | ICD-10-CM

## 2023-11-25 NOTE — Therapy (Signed)
 OUTPATIENT PHYSICAL THERAPY THORACOLUMBAR TREATMENT   Patient Name: Joseph [name] MRN: [id number] DOB:06-Feb-1961, 63 y.o., male Today's Date: 11/25/2023  END OF SESSION:  PT End of Session - 11/25/23 0906     Visit Number 2    Number of Visits 19    Date for PT Re-Evaluation 02/25/24    Authorization Type [employer] PPO    PT Start Time 0800    PT Stop Time 0843    PT Time Calculation (min) 43 min    Activity Tolerance Patient tolerated treatment well    Behavior During Therapy WFL for tasks assessed/performed              Past Medical History:  Diagnosis Date   Allergy    Pneumonia    Past Surgical History:  Procedure Laterality Date   BREAST BIOPSY     breast lump - benign   LIPOMA EXCISION N/A 04/23/2021   Procedure: EXCISION BACK MASS;  Surgeon: Abigail Miyamoto, MD;  Location: [facility] OR;  Service: General;  Laterality: N/A;   Patient Active Problem List   Diagnosis Date Noted   Dyslipidemia 10/24/2023   Anxiety 10/21/2023   Chronic back pain 12/09/2020   Insomnia 12/09/2020   Overweight 12/09/2020   Pruritus 10/17/2018   Tobacco use disorder 02/27/2013    PCP: Ardith Dark, MD   REFERRING PROVIDER: Richardean Sale, DO  REFERRING DIAG: M54.50 (ICD-10-CM) - Low back pain, unspecified back pain laterality, unspecified chronicity, unspecified whether sciatica present  Rationale for Evaluation and Treatment: Rehabilitation  THERAPY DIAG:  Other low back pain  Muscle weakness (generalized)  Other muscle spasm  Abnormal posture  ONSET DATE: Chronic  SUBJECTIVE:                                                                                                                                                                                           SUBJECTIVE STATEMENT: Patient reports his pain is not as bad this morning, but last night he had increased pain. He was unable to raise his left leg up. Currently pain is 5/10   From Eval: Patient presents  with chronic back pain. He has been to multiple chiropractors, and it provided short term benefits. He reports having good days and bad days. Some days he cannot get out of bed. He got a mass removed 2 years ago in between his shoulder blades and that causes him some discomfort. In a two week span he reports having around 2 bad days. He travels a lot for work and depending on the type of bed he sleeps on it aggravates his back. He sometimes gets numbness  in his legs after standing 10-15 minutes. Numbness will subside 10-15 mins after he sits down. Sitting tolerance for driving is poor. He is able to drive for 1-2 hours before needing to stand up and stretch. His walking tolerance is not as bad as his standing and sitting tolerance.   PERTINENT HISTORY:  Anxiety;   PAIN:  Are you having pain? Yes: NPRS scale: 3-4(currently) 9-10(worst)/10 Pain location: bilateral lumbar area & in between shoulder blades Pain description: mainly dull sometimes sharp Aggravating factors: lifting; sleeping position  Relieving factors: Ibuprofen   PRECAUTIONS: None  RED FLAGS: None   WEIGHT BEARING RESTRICTIONS: No  FALLS:  Has patient fallen in last 6 months? 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  DIAGNOSTIC FINDINGS:  Lumbar Spine Xray  IMPRESSION: 1. Normal appearance of the lumbar spine. 2. Small lower pole left intrarenal stones.  PATIENT SURVEYS:  Modified Oswestry 16/50 32%   COGNITION: Overall cognitive status: Within functional limits for tasks  assessed     SENSATION: Some leg numbness after standing > 10-15 mins  MUSCLE LENGTH: Hamstrings: decreased bilateral Lt > Rt    POSTURE: rounded shoulders and forward head  PALPATION: Increased muscle spasms bilateral lumbar paraspinals Decreased joint mobility throughout Lt spine. Patient verbalized some soreness  LUMBAR ROM: no pain with these motions but some tightness   AROM eval  Flexion Bends to skins  Extension 30% limited  Right lateral flexion Bends to knee joint line  Left lateral flexion Bends to knee joint line  Right rotation full  Left rotation full   (Blank rows = not tested)  LOWER EXTREMITY ROM:   WFL bilateral    LOWER EXTREMITY MMT:  Grossly 4-4+/5 bilateral     FUNCTIONAL TESTS:  5 times sit to stand: 11.82 sec no UE support Sit to stand somewhat guarded when performing   GAIT: Comments: Mildly guarded; decreased cadence  TREATMENT DATE:    11/25/2023 NuStep Level 3 - 3 minus - PT present to discuss status- stopped at 3 minutes because patient had increased Lt leg numbness that went down to foot Standing hamstring stretch at stairs 2 x 30 sec bilateral 3 way stability ball stretch x 5 each direction 3 sec hold (added to HEP) Showed patient how he can do above stretch in a hotel room Standing hip flexion stretch on stairs 2 x 30 sec bilateral (added to HEP) Supine LTR x 10 each direction (added to HEP) Open Books x 8 each direction TA activation x 20 TA activation + ball squeeze 2 x 10 Sit to stand hold 5# DB x 10 Iso hip extension with green TB + hip flexion march 2 x 10  11/22/2023 Initial Evaluation & HEP Created  PATIENT EDUCATION:  Education details: POC; HEP Person educated: Patient Education method: Programmer, multimedia, Demonstration, and Handouts Education comprehension: verbalized understanding, returned  demonstration, and needs further education  HOME EXERCISE PROGRAM: Access Code: [email or web address] URL: [email or web address] Date: 11/22/2023 Prepared by: Claude Manges  Exercises - Supine Lower Trunk Rotation  - 1 x daily - 7 x weekly - 10 reps - Sidelying Thoracic Rotation with Open Book  - 1 x daily - 7 x weekly - 5 reps - Seated Hamstring Stretch  - 1 x daily - 7 x weekly - 2 sets - 20-30 hold - Seated Figure 4 Piriformis Stretch  - 1 x daily - 7 x weekly - 2 sets - 20-30 hold - Standing 'L' Stretch at Counter  - 1 x daily - 7 x weekly - 2 sets - 5 reps - 5 hold  ASSESSMENT:  CLINICAL IMPRESSION: [name] present with 5/ 10 back pain today. He verbalized compliance with HEP and he feels the exercises are helping his pain. Patient required minimal verbal cues for correct exercise performance. Incorporated TA activation exercises and patient required max verbal and tactile cues for correct activation. Updated HEP to include some exercises from treatment session. Overall, patient should progress well with physical therapy.    OBJECTIVE IMPAIRMENTS: decreased endurance, decreased mobility, decreased ROM, decreased strength, hypomobility, increased muscle spasms, impaired flexibility, impaired sensation, postural dysfunction, and pain.   ACTIVITY LIMITATIONS: lifting, bending, sitting, standing, squatting, sleeping, and stairs  PARTICIPATION LIMITATIONS: cleaning, laundry, driving, community activity, and occupation  PERSONAL FACTORS: Fitness, Profession, Time since onset of injury/illness/exacerbation, and 1 comorbidity: anxiety  are also affecting patient's functional outcome.   REHAB POTENTIAL: Good  CLINICAL DECISION MAKING: Stable/uncomplicated  EVALUATION COMPLEXITY: Low   GOALS: Goals reviewed with patient? Yes  SHORT TERM GOALS: Target date: 01/03/2024  Patient will be independent with initial HEP. Baseline:  Goal status: INITIAL  2.  Patient will report > or = to  30% improvement in back pain since starting PT. Baseline:  Goal status: INITIAL     LONG TERM GOALS: Target date: 02/25/2024 Patient will demonstrate independence in advanced HEP. Baseline:  Goal status: INITIAL  2.  Patient will report > or = to 70% improvement in back pain since starting PT. Baseline:  Goal status: INITIAL  3.  Patient will verbalize and demonstrate self-care strategies to manage pain including tissue mobility practices and change of position. Baseline:  Goal status: INITIAL   4.  Patient will be able to stand for > or = to 30 minutes before onset of leg numbness. Baseline: 10-15 minutes Goal status: INITIAL  5.  Patient will be able to travel for work with < or = to 2/10 back pain. Baseline:  Goal status: INITIAL   PLAN:  PT FREQUENCY: 2x/week  PT DURATION: other: 15 weeks. (Patient will be traveling and will not return until 02/14/2024)  PLANNED INTERVENTIONS: 97164- PT Re-evaluation, 97110-Therapeutic exercises, 97530- Therapeutic activity, 97112- Neuromuscular re-education, 97535- Self Care, 54098- Manual therapy, [phone]- Gait training, [phone]- Canalith repositioning, [id number]- Aquatic Therapy, [phone]- Electrical stimulation (unattended), [phone]- Electrical stimulation (manual), [id number]- Vasopneumatic device, [id number]- Ultrasound, [id number]- Traction (mechanical), [id number]- Ionotophoresis 4mg /ml Dexamethasone, Patient/Family education, Balance training, Stair training, Taping, Dry Needling, Joint mobilization, Joint manipulation, Spinal manipulation, Spinal mobilization, Vestibular training, Cryotherapy, and Moist heat.  PLAN FOR NEXT SESSION: Assess updated HEP; continue TA activation and progress as tolerated; lumbar and hip flexibility   Claude Manges, PT 11/25/23 9:08 AM [facility] [address]  [place], [place] [address] Phone # [phone] Fax [phone]

## 2023-11-28 ENCOUNTER — Ambulatory Visit

## 2024-02-07 NOTE — Progress Notes (Deleted)
    Ben Jackson D.[facility] [address] [place] [address] Phone: [phone]   Assessment and Plan:     There are no diagnoses linked to this encounter.  ***   Pertinent previous records reviewed include ***    Follow Up: ***     Subjective:   I, Joseph [name], am serving as a Neurosurgeon for Doctor Morene Mace  Chief Complaint: low back pain    HPI:    10/26/2023 Patient is a 63 year old male with low back pain. Patient states can't stand for a long period of time. Sometimes the pain is in between his shoulder blades. Hard to get out of bed sometimes. Pain is chronic for over 30 years now. Patient used to take vicodin daily to cope with the pain but he stopped cold [place] because he knew that all that medication was not good for him. He has tried chiropractors and other doctors and the pain has not gotten any better.    Duration? years Did you have an Injury to cause this pain? 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 none Numbness or Tingling? Yes more of a dull pain right leg Does the pain Radiate? Yes once in a while Altered gait or use? no ROM/ impairment of movement? Bending and reaching   11/23/2023 Patient states that he is the same. Meloxicam  helps the most    02/15/2024 Patient states   Relevant Historical Information: None pertinent    Additional pertinent review of systems negative.   Current Outpatient Medications:    atorvastatin  (LIPITOR) 40 MG tablet, Take 1 tablet (40 mg total) by mouth daily., Disp: 90 tablet, Rfl: 3   citalopram  (CELEXA ) 20 MG tablet, TAKE 1 TABLET BY MOUTH EVERY DAY, Disp: 90 tablet, Rfl: 2   escitalopram  (LEXAPRO ) 5 MG tablet, Take 1 tablet (5 mg total) by mouth daily., Disp: 90 tablet, Rfl: 1   meloxicam  (MOBIC ) 15 MG tablet, Take 1 tablet (15 mg total) by mouth daily., Disp: 30 tablet, Rfl: 0   meloxicam  (MOBIC ) 15 MG tablet, Take 1 tablet (15 mg  total) by mouth daily as needed for pain., Disp: 30 tablet, Rfl: 1   tirzepatide  (ZEPBOUND ) 2.5 MG/0.5ML Pen, Inject 2.5 mg into the skin once a week., Disp: 2 mL, Rfl: 0   Vitamin D , Ergocalciferol , (DRISDOL ) 1.25 MG (50000 UNIT) CAPS capsule, Take 1 capsule (50,000 Units total) by mouth every 7 (seven) days., Disp: 5 capsule, Rfl: 3   Objective:     There were no vitals filed for this visit.    There is no height or weight on file to calculate BMI.    Physical Exam:    ***   Electronically signed by:  Odis Mace D.[facility] 8:22 AM 02/07/24

## 2024-02-15 ENCOUNTER — Ambulatory Visit: Admitting: Sports Medicine

## 2024-02-21 ENCOUNTER — Encounter: Payer: Self-pay | Admitting: Sports Medicine

## 2024-03-04 ENCOUNTER — Other Ambulatory Visit: Payer: Self-pay | Admitting: Family Medicine

## 2024-03-04 DIAGNOSIS — E559 Vitamin D deficiency, unspecified: Secondary | ICD-10-CM

## 2024-03-08 NOTE — Telephone Encounter (Signed)
 Copied from CRM [id number]. Topic: Clinical - Prescription Issue >> Mar 08, 2024  2:12 PM Suzen RAMAN wrote: Reason for CRM: Patient submitted a request for refill on Vitamin D , Ergocalciferol , (DRISDOL ) 1.25 MG (50000 UNIT) CAPS capsule. Per Rx notation refill refused and marked as not appropriate. Patient would like a call back for further clarification on why and whether he needs to continue taking medication.   Cb#[phone] (M)   Spoke with patient advise to continue with OTC Vid D  Schedule a lab appt to repeat labs  Stated [place] will schedule an appt when he gets back [facility]

## 2024-04-30 ENCOUNTER — Ambulatory Visit (INDEPENDENT_AMBULATORY_CARE_PROVIDER_SITE_OTHER): Admitting: Family Medicine

## 2024-04-30 ENCOUNTER — Encounter: Payer: Self-pay | Admitting: Family Medicine

## 2024-04-30 VITALS — BP 127/85 | HR 75 | Temp 98.0°F | Ht 68.0 in | Wt 197.4 lb

## 2024-04-30 DIAGNOSIS — F419 Anxiety disorder, unspecified: Secondary | ICD-10-CM

## 2024-04-30 DIAGNOSIS — Z23 Encounter for immunization: Secondary | ICD-10-CM

## 2024-04-30 DIAGNOSIS — E785 Hyperlipidemia, unspecified: Secondary | ICD-10-CM

## 2024-04-30 DIAGNOSIS — E559 Vitamin D deficiency, unspecified: Secondary | ICD-10-CM | POA: Diagnosis not present

## 2024-04-30 DIAGNOSIS — E7800 Pure hypercholesterolemia, unspecified: Secondary | ICD-10-CM

## 2024-04-30 MED ORDER — ATORVASTATIN CALCIUM 40 MG PO TABS: 40.0000 mg | ORAL_TABLET | Freq: Every day | ORAL | 3 refills | Status: AC

## 2024-04-30 NOTE — Assessment & Plan Note (Signed)
 Doing well with Lipitor 40 mg daily.  No significant side effects.  Will refill today.

## 2024-04-30 NOTE — Patient Instructions (Signed)
 It was very nice to see you today!  VISIT SUMMARY: Today, you had a follow-up visit to discuss your vitamin D  levels and medication management. We also addressed your cholesterol medication and anxiety treatment.  YOUR PLAN: VITAMIN D  DEFICIENCY: Your vitamin D  levels have improved but are still too low for your dental implants. -Increase your vitamin D  supplement to 10,000 IU daily until your levels improve. Once they are better, reduce to 5,000 IU daily for maintenance.  HYPERCHOLESTEROLEMIA: You have run out of your cholesterol medication. -Your cholesterol medication prescription has been refilled.  ANXIETY DISORDER: Your current medication, escitalopram  5 mg, is working well for your anxiety. -Continue taking escitalopram  5 mg daily.  GENERAL HEALTH MAINTENANCE: We discussed the importance of maintaining your general health, including vaccinations. -You received a flu shot today.  Return in about 6 months (around 10/28/2024) for Annual Physical.   Take care, Dr Kennyth  PLEASE NOTE:  If you had any lab tests, please let us  know if you have not heard back within a few days. You may see your results on mychart before we have a chance to review them but we will give you a call once they are reviewed by us .   If we ordered any referrals today, please let us  know if you have not heard from their office within the next week.   If you had any urgent prescriptions sent in today, please check with the pharmacy within an hour of our visit to make sure the prescription was transmitted appropriately.   Please try these tips to maintain a healthy lifestyle:  Eat at least 3 REAL meals and 1-2 snacks per day.  Aim for no more than 5 hours between eating.  If you eat breakfast, please do so within one hour of getting up.   Each meal should contain half fruits/vegetables, one quarter protein, and one quarter carbs (no bigger than a computer mouse)  Cut down on sweet beverages. This includes  juice, soda, and sweet tea.   Drink at least 1 glass of water with each meal and aim for at least 8 glasses per day  Exercise at least 150 minutes every week.

## 2024-04-30 NOTE — Progress Notes (Signed)
   LYSLE YERO is a 63 y.o. male who presents today for an office visit.  Assessment/Plan:  Chronic Problems Addressed Today: Anxiety Symptoms are currently stable on Lexapro  5 mg daily.  Will take citalopram  off of this medication list.  Avitaminosis D He is working on getting dental implants and needs his vitamin D  level to be at goal.  He recently picked up over-the-counter prescription.  Recommended 10000 IUs daily until his levels are repleted.  He can come back here to get this rechecked or he can get this checked with his oral surgeon.  Dyslipidemia Doing well with Lipitor 40 mg daily.  No significant side effects.  Will refill today.  Flu shot given today.   Subjective:  HPI:  See assessment / plan for status of chronic conditions.   Discussed the use of AI scribe software for clinical note transcription with the patient, who gave verbal consent to proceed.  History of Present Illness Tag Wurtz is a 63 year old male who presents for a follow-up regarding vitamin D  levels and medication management.  He is preparing for dental implants, which have been delayed due to low vitamin D  levels. His last vitamin D  level was checked in July and was in the lower twenties, an improvement from a previous level of 14.7. He has started taking over-the-counter vitamin D  supplements, specifically 5,000 IU daily.  He has run out of his cholesterol medication and needs a refill. He has not experienced any side effects from this medication and is doing well with it.  He is currently taking escitalopram  5 mg for anxiety, which is working well for him. He previously took citalopram  but is no longer on it.         Objective:  Physical Exam: BP 127/85   Pulse 75   Temp 98 F (36.7 C) (Temporal)   Ht 5' 8 (1.727 m)   Wt 197 lb 6.4 oz (89.5 kg)   SpO2 96%   BMI 30.01 kg/m   Gen: No acute distress, resting comfortably CV: Regular rate and rhythm with no murmurs  appreciated Pulm: Normal work of breathing, clear to auscultation bilaterally with no crackles, wheezes, or rhonchi Neuro: Grossly normal, moves all extremities Psych: Normal affect and thought content      Ruperto Kiernan M. Kennyth, MD 04/30/2024 9:30 AM

## 2024-04-30 NOTE — Assessment & Plan Note (Signed)
 He is working on getting dental implants and needs his vitamin D  level to be at goal.  He recently picked up over-the-counter prescription.  Recommended 10000 IUs daily until his levels are repleted.  He can come back here to get this rechecked or he can get this checked with his oral surgeon.

## 2024-04-30 NOTE — Assessment & Plan Note (Signed)
 Symptoms are currently stable on Lexapro  5 mg daily.  Will take citalopram  off of this medication list.
